# Patient Record
Sex: Male | Born: 1985 | Race: White | Hispanic: No | Marital: Married | Smoking: Never smoker
Health system: Southern US, Community
[De-identification: ages and names within clinical notes are randomized; demographics above are authoritative.]

## PROBLEM LIST (undated history)

## (undated) HISTORY — PX: HERNIA REPAIR: SHX51

---
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
## 2019-07-09 ENCOUNTER — Emergency Department (HOSPITAL_COMMUNITY)
Admission: EM | Admit: 2019-07-09 | Discharge: 2019-07-09 | Disposition: A | Discharge status: Home / Self Care | Payer: Self-pay | Attending: Emergency Medicine | Admitting: Emergency Medicine

## 2019-07-09 ENCOUNTER — Other Ambulatory Visit: Payer: Self-pay

## 2019-07-09 ENCOUNTER — Encounter (HOSPITAL_COMMUNITY): Payer: Self-pay | Admitting: Emergency Medicine

## 2019-07-09 ENCOUNTER — Emergency Department (HOSPITAL_COMMUNITY): Payer: Self-pay

## 2019-07-09 DIAGNOSIS — R0981 Nasal congestion: Secondary | ICD-10-CM | POA: Insufficient documentation

## 2019-07-09 DIAGNOSIS — M5416 Radiculopathy, lumbar region: Secondary | ICD-10-CM | POA: Insufficient documentation

## 2019-07-09 DIAGNOSIS — M545 Low back pain, unspecified: Secondary | ICD-10-CM

## 2019-07-09 DIAGNOSIS — R05 Cough: Secondary | ICD-10-CM | POA: Insufficient documentation

## 2019-07-09 DIAGNOSIS — R202 Paresthesia of skin: Secondary | ICD-10-CM | POA: Insufficient documentation

## 2019-07-09 LAB — COMPREHENSIVE METABOLIC PANEL
ALT: 25 U/L (ref 0–44)
AST: 17 U/L (ref 15–41)
Albumin: 4.3 g/dL (ref 3.5–5.0)
Alkaline Phosphatase: 77 U/L (ref 38–126)
Anion gap: 10 (ref 5–15)
BUN: 17 mg/dL (ref 6–20)
CO2: 24 mmol/L (ref 22–32)
Calcium: 8.9 mg/dL (ref 8.9–10.3)
Chloride: 109 mmol/L (ref 98–111)
Creatinine, Ser: 0.82 mg/dL (ref 0.61–1.24)
GFR calc Af Amer: >60 mL/min (ref >60)
GFR calc non Af Amer: >60 mL/min (ref >60)
Glucose, Bld: 88 mg/dL (ref 70–99)
Potassium: 4 mmol/L (ref 3.5–5.1)
Sodium: 143 mmol/L (ref 135–145)
Total Bilirubin: 0.7 mg/dL (ref 0.3–1.2)
Total Protein: 7.3 g/dL (ref 6.5–8.1)

## 2019-07-09 LAB — CBC WITH DIFFERENTIAL/PLATELET
Abs Immature Granulocytes: 0.03 10*3/uL (ref 0.00–0.07)
Basophils Absolute: 0.1 10*3/uL (ref 0.0–0.1)
Basophils Relative: 1 %
Eosinophils Absolute: 0.3 10*3/uL (ref 0.0–0.5)
Eosinophils Relative: 3 %
HCT: 45.5 % (ref 39.0–52.0)
Hemoglobin: 14.9 g/dL (ref 13.0–17.0)
Immature Granulocytes: 0 %
Lymphocytes Relative: 23 %
Lymphs Abs: 2.4 10*3/uL (ref 0.7–4.0)
MCH: 29.6 pg (ref 26.0–34.0)
MCHC: 32.7 g/dL (ref 30.0–36.0)
MCV: 90.5 fL (ref 80.0–100.0)
Monocytes Absolute: 1.5 10*3/uL — ABNORMAL HIGH (ref 0.1–1.0)
Monocytes Relative: 14 %
Neutro Abs: 6.1 10*3/uL (ref 1.7–7.7)
Neutrophils Relative %: 59 %
Platelets: 216 10*3/uL (ref 150–400)
RBC: 5.03 MIL/uL (ref 4.22–5.81)
RDW: 12.7 % (ref 11.5–15.5)
WBC: 10.5 10*3/uL (ref 4.0–10.5)
nRBC: 0 % (ref 0.0–0.2)

## 2019-07-09 LAB — URINALYSIS, ROUTINE W REFLEX MICROSCOPIC
Bacteria, UA: NONE SEEN
Bilirubin Urine: NEGATIVE
Glucose, UA: NEGATIVE mg/dL
Ketones, ur: NEGATIVE mg/dL
Leukocytes,Ua: NEGATIVE
Nitrite: NEGATIVE
Protein, ur: NEGATIVE mg/dL
Specific Gravity, Urine: 1.027 (ref 1.005–1.030)
pH: 5 (ref 5.0–8.0)

## 2019-07-09 LAB — LACTIC ACID, PLASMA: Lactic Acid, Venous: 1 mmol/L (ref 0.5–1.9)

## 2019-07-09 MED ORDER — HYDROCODONE-ACETAMINOPHEN 5-325 MG PO TABS: 1.0000 | ORAL_TABLET | Freq: Four times a day (QID) | ORAL | 0 refills | Status: AC | PRN

## 2019-07-09 MED ORDER — HYDROCODONE-ACETAMINOPHEN 5-325 MG PO TABS
1.0000 | ORAL_TABLET | Freq: Once | ORAL | Status: AC
  Administered 2019-07-09: 1 via ORAL
  Filled 2019-07-09: qty 1

## 2019-07-09 MED ORDER — LIDOCAINE 5 % EX PTCH
1.0000 | MEDICATED_PATCH | CUTANEOUS | Status: DC
  Administered 2019-07-09: 1 via TRANSDERMAL
  Filled 2019-07-09: qty 1

## 2019-07-09 MED ORDER — PREDNISONE 10 MG PO TABS
50.0000 mg | ORAL_TABLET | Freq: Every day | ORAL | 0 refills | Status: AC
Start: 2019-07-09 — End: 2019-07-14

## 2019-07-09 NOTE — ED Notes (Signed)
Patient is getting into a gown at this time, denies needs for assistance.Patient denies claustrophobia or need for concerning internal metal.

## 2019-07-09 NOTE — ED Provider Notes (Signed)
[facility]-EMERGENCY DEPT Provider Note   CSN: [id number] Arrival date & time: 07/09/19  1037     History Chief Complaint  Patient presents with  . Back Pain    Russell Stephens is a 34 y.o. male.  HPI HPI Comments: Russell Stephens is a 34 y.o. male who presents to the Emergency Department complaining of lumbar pain.  Patient states he woke this morning with 10/10 pain in his lumbar region.  Pain worsens with any movement.  He went to urgent care and had x-rays performed which were negative at the time.  He recently had a viral infection and was sent to the emergency department for further evaluation.  He states that he was experiencing cough and congestion which is gradually alleviated for the last few days.  He reports some associated mild tingling in his bilateral lower extremities.  No bowel or bladder incontinence.  He took ibuprofen for his pain with minimal relief.  No history of IVDA.  He denies fevers, chills, chest pain, shortness of breath, abdominal pain, nausea, vomiting, diarrhea, urinary changes, hematuria, dysuria, urinary frequency, urinary difficulty, syncope.     History reviewed. No pertinent past medical history.  There are no problems to display for this patient.   History reviewed. No pertinent surgical history.     No family history on file.  Social History   Tobacco Use  . Smoking status: Not on file  Substance Use Topics  . Alcohol use: Not on file  . Drug use: Not on file    Home Medications Prior to Admission medications   Medication Sig Start Date End Date Taking? Authorizing Provider  albuterol (VENTOLIN HFA) 108 (90 Base) MCG/ACT inhaler Inhale 2 puffs into the lungs every 6 (six) hours as needed for wheezing or shortness of breath.   Yes [provider]  Pseudoephedrine-APAP-DM (DAYQUIL PO) Take 30 mLs by mouth every 4 (four) hours as needed (cold and flu).   Yes [provider]    Allergies    Dairy  aid [lactase]  Review of Systems   Review of Systems  All other systems reviewed and are negative. Ten systems reviewed and are negative for acute change, except as noted in the HPI.   Physical Exam Updated Vital Signs BP (!) 152/93 (BP Location: Left Arm)   Pulse 95   Temp 98.1 F (36.7 C) (Oral)   Resp 18   SpO2 97%   Physical Exam Vitals and nursing note reviewed.  Constitutional:      General: He is in acute distress.     Appearance: Normal appearance. He is normal weight. He is not ill-appearing, toxic-appearing or diaphoretic.  HENT:     Head: Normocephalic and atraumatic.     Right Ear: External ear normal.     Left Ear: External ear normal.     Nose: Nose normal.     Mouth/Throat:     Mouth: Mucous membranes are moist.     Pharynx: Oropharynx is clear. No oropharyngeal exudate or posterior oropharyngeal erythema.  Eyes:     General: No scleral icterus.       Right eye: No discharge.        Left eye: No discharge.     Extraocular Movements: Extraocular movements intact.     Conjunctiva/sclera: Conjunctivae normal.     Pupils: Pupils are equal, round, and reactive to light.  Cardiovascular:     Rate and Rhythm: Normal rate and regular rhythm.  Pulses: Normal pulses.     Heart sounds: Normal heart sounds. No murmur. No friction rub. No gallop.   Pulmonary:     Effort: Pulmonary effort is normal. No respiratory distress.     Breath sounds: Normal breath sounds. No stridor. No wheezing, rhonchi or rales.  Abdominal:     General: Abdomen is flat.     Palpations: Abdomen is soft.     Tenderness: There is no abdominal tenderness.  Musculoskeletal:        General: Tenderness present. No signs of injury.     Cervical back: Normal range of motion.     Right lower leg: No edema.     Left lower leg: No edema.     Comments: Mild TTP noted in the midline lumbar region.  Significant TTP noted just right lateral of the midline lumbar spine along the paraspinal  musculature.  No erythema or edema noted.  No signs of injury.  Negative straight leg raise on the right.  Negative contralateral straight leg raise on the left.  Negative Kernig's and Brudzinski's sign's.  Skin:    General: Skin is warm and dry.  Neurological:     General: No focal deficit present.     Mental Status: He is alert and oriented to person, place, and time.     Comments: Distal sensation intact.  2+ patella DTRs noted bilaterally.  Unable to assess patient's gait due to severity of pain.  Strength is 5 out of 5 with flexion and extension of the lower extremities.  Psychiatric:        Mood and Affect: Mood normal.        Behavior: Behavior normal.    ED Results / Procedures / Treatments   Labs (all labs ordered are listed, but only abnormal results are displayed) Labs Reviewed  CBC WITH DIFFERENTIAL/PLATELET - Abnormal; Notable for the following components:      Result Value   Monocytes Absolute 1.5 (*)    All other components within normal limits  URINALYSIS, ROUTINE W REFLEX MICROSCOPIC - Abnormal; Notable for the following components:   Hgb urine dipstick SMALL (*)    All other components within normal limits  COMPREHENSIVE METABOLIC PANEL  LACTIC ACID, PLASMA   EKG None  Radiology MR LUMBAR SPINE WO CONTRAST  Result Date: 07/09/2019 CLINICAL DATA:  Low back pain, no red flags. Additional history provided: Patient reports low back pain since this morning, pain goes down both legs with numbness and tingling. EXAM: MRI LUMBAR SPINE WITHOUT CONTRAST TECHNIQUE: Multiplanar, multisequence MR imaging of the lumbar spine was performed. No intravenous contrast was administered. COMPARISON:  Report from CT abdomen/pelvis 04/17/2001 (images unavailable). FINDINGS: Segmentation: For the purposes of this dictation, five lumbar vertebrae are assumed and the caudal most well-formed intervertebral disc is designated L5-S1. Alignment: Straightening of the expected lumbar lordosis. 3 mm  L5-S1 grade 1 retrolisthesis. Vertebrae: Vertebral body height is maintained. Trace degenerative endplate edema at O6-Z1. Conus medullaris and cauda equina: Conus extends to the L1 level. No signal abnormality within the visualized distal spinal cord. Paraspinal and other soft tissues: No abnormality identified within included portions of the abdomen/retroperitoneum. Paraspinal soft tissues within normal limits. Disc levels: Moderate L5-S1 disc degeneration. Intervertebral disc height is otherwise maintained. T12-L1: No significant disc herniation or stenosis. L1-L2: No significant disc herniation or stenosis. L2-L3: No significant disc herniation or stenosis. L3-L4: No significant disc herniation or stenosis. L4-L5: No significant disc herniation or stenosis. L5-S1: Mild grade 1 retrolisthesis. Disc  bulge. Superimposed broad-based central disc extrusion with cephalad migration to the inferior L5 level. The disc protrusion minimally effaces the ventral thecal sac. The disc protrusion also contributes to minimal left subarticular narrowing with possible contact upon the descending left S1 nerve root. No significant right subarticular or central canal stenosis. No significant foraminal narrowing. IMPRESSION: For the purposes of this dictation, five lumbar vertebrae are assumed and the caudal most well-formed intervertebral disc is designated L5-S1. At L5-S1, there is mild grade 1 retrolisthesis. Moderate disc degeneration. Disc bulge. Superimposed broad-based central disc extrusion with mild cephalad migration. The disc protrusion minimally effaces the ventral thecal sac. The disc protrusion also contributes to minimal left subarticular narrowing with possible contact upon the descending left S1 nerve root. No significant right subarticular or central canal stenosis. No significant neural foraminal narrowing. No significant disc herniation, spinal canal stenosis or neural foraminal narrowing at the remaining lumbar  levels. Electronically Signed   By: Jackey Loge DO   On: 07/09/2019 17:25   Procedures Procedures (including critical care time)  Medications Ordered in ED Medications  lidocaine (LIDODERM) 5 % 1 patch (has no administration in time range)  HYDROcodone-acetaminophen (NORCO/VICODIN) 5-325 MG per tablet 1 tablet (1 tablet Oral Given 07/09/19 1511)   ED Course  I have reviewed the triage vital signs and the nursing notes.  Pertinent labs & imaging results that were available during my care of the patient were reviewed by me and considered in my medical decision making (see chart for details).    MDM Rules/Calculators/A&P                      4:21 PM patient is a 34 year old male with a history of lumbar strain.  Patient does have some mild midline lumbar tenderness.  Most of his tenderness is along the right lateral musculature of the lumbar region.  His physical exam is generally reassuring.  He notes some tingling in his lower extremities but his distal sensation is intact.  No symptoms of cauda equina.  No symptoms consistent with sciatica.  Due to the acute atraumatic nature of his symptoms and the tingling in his lower extremities will obtain an MRI of the lumbar spine.  His initial labs are reassuring.  No leukocytosis.  Afebrile.  Vital signs are stable.  Will reassess.  6:16 PM I spoke with patient and he says his pain is mildly alleviated.  We discussed his MRI which shows mild grade 1 retrolisthesis at L5-S1.  Additionally there is some moderate disc degeneration and a disc bulge.  Superimposed broad-based central disc extrusion with mild cephalad migration.  The disc protrusion minimally effaces the ventral thecal sac.  It also contributes to minimal left subarticular narrowing with possible contact upon the descending left S1 nerve root.  I discussed this with patient and that this could likely be the cause of the symptoms.  He does not have health insurance so I gave him referral to  [facility].  He also requests referral to [facility].  He is going to follow-up with them.  I prescribed a short course of Norco for breakthrough pain and recommend ibuprofen and Tylenol for ongoing pain.  I also recommended Voltaren gel.  Patient also given a prednisone burst.  He understands to return to the emergency department with any new or worsening symptoms.  His questions were answered and he verbalized understanding of the above plan.  He was amicable at the time of discharge.  Vital signs stable.  Patient discharged to home/self care.  Condition at discharge: Stable  Note: Portions of this report may have been transcribed using voice recognition software. Every effort was made to ensure accuracy; however, inadvertent computerized transcription errors may be present.    Final Clinical Impression(s) / ED Diagnoses Final diagnoses:  Acute midline low back pain without sciatica  Lumbar radiculopathy    Rx / DC Orders ED Discharge Orders         Ordered    HYDROcodone-acetaminophen (NORCO/VICODIN) 5-325 MG tablet  Every 6 hours PRN     07/09/19 1802    predniSONE (DELTASONE) 10 MG tablet  Daily with breakfast     07/09/19 1802           Placido Sou, PA-C 07/09/19 1831    Charlynne Pander, MD 07/10/19 [phone]

## 2019-07-09 NOTE — Discharge Instructions (Addendum)
Per our discussion, I am prescribing you 2 new medications.  The first medication is Vicodin which is a strong narcotic pain reliever.  You can take this 2-3 times per day for breakthrough pain.  Please keep in mind that this has Tylenol in it as well.  So, if you also take Tylenol please be sure to not exceed 4 g/day.  This medication can also be constipating so please be sure to stay hydrated and eat an appropriate amount of fiber.  I am also prescribing you prednisone.  This is a steroid medication and can be used to help with the inflammation that may or may not be causing her back pain.  Please take this in the morning as it can have a stimulating effect and make it difficult to sleep.  I would recommend purchasing Voltaren gel and applying to the affected region as needed.  Please follow-up with  and wellness as well as [facility] regarding this visit.  Please do not hesitate to return to the emergency department with any new or worsening symptoms.

## 2019-07-09 NOTE — ED Triage Notes (Signed)
Per pt, states he woke up this am with lower back pain-no injury or trauma-went to UC a had urine and xray done-both negative-told him to come to ED for possible infection in spine-no IV drugs

## 2020-08-06 ENCOUNTER — Ambulatory Visit
Admission: EM | Admit: 2020-08-06 | Discharge: 2020-08-06 | Disposition: A | Discharge status: Home / Self Care | Payer: 59 | Attending: Emergency Medicine | Admitting: Emergency Medicine

## 2020-08-06 ENCOUNTER — Other Ambulatory Visit: Payer: Self-pay

## 2020-08-06 DIAGNOSIS — J0100 Acute maxillary sinusitis, unspecified: Secondary | ICD-10-CM | POA: Diagnosis present

## 2020-08-06 DIAGNOSIS — J029 Acute pharyngitis, unspecified: Secondary | ICD-10-CM | POA: Insufficient documentation

## 2020-08-06 LAB — POCT RAPID STREP A (OFFICE): Rapid Strep A Screen: NEGATIVE

## 2020-08-06 MED ORDER — AMOXICILLIN 875 MG PO TABS: 875.0000 mg | ORAL_TABLET | Freq: Two times a day (BID) | ORAL | 0 refills | Status: AC

## 2020-08-06 NOTE — ED Provider Notes (Signed)
Renaldo Fiddler    CSN: [id number] Arrival date & time: 08/06/20  1127      History   Chief Complaint Chief Complaint  Patient presents with  . Cough    HPI Russell Stephens is a 35 y.o. male.   Patient presents with 2-week history of sore throat, nasal congestion, postnasal drip, cough.  He reports hoarse voice x2 days.  He denies fever, chills, rash, shortness of breath, vomiting, diarrhea, or other symptoms.  OTC cough and cold medication taken at home.  He denies pertinent medical history.  The history is provided by the patient.    History reviewed. No pertinent past medical history.  There are no problems to display for this patient.   Past Surgical History:  Procedure Laterality Date  . HERNIA REPAIR         Home Medications    Prior to Admission medications   Medication Sig Start Date End Date Taking? Authorizing Provider  amoxicillin (AMOXIL) 875 MG tablet Take 1 tablet (875 mg total) by mouth 2 (two) times daily for 7 days. 08/06/20 08/13/20 Yes Mickie Bail, NP  albuterol (VENTOLIN HFA) 108 (90 Base) MCG/ACT inhaler Inhale 2 puffs into the lungs every 6 (six) hours as needed for wheezing or shortness of breath.    [provider]  Pseudoephedrine-APAP-DM (DAYQUIL PO) Take 30 mLs by mouth every 4 (four) hours as needed (cold and flu).    [provider]    Family History No family history on file.  Social History Social History   Tobacco Use  . Smoking status: Never Smoker  . Smokeless tobacco: Never Used  Substance Use Topics  . Alcohol use: Yes  . Drug use: Not Currently     Allergies   Dairy aid [lactase]   Review of Systems Review of Systems  Constitutional: Negative for chills and fever.  HENT: Positive for congestion, postnasal drip, rhinorrhea, sinus pressure, sore throat and voice change. Negative for ear pain.   Respiratory: Positive for cough. Negative for shortness of breath.   Cardiovascular: Negative for  chest pain and palpitations.  Gastrointestinal: Negative for abdominal pain, diarrhea and vomiting.  Skin: Negative for color change and rash.  All other systems reviewed and are negative.    Physical Exam Triage Vital Signs ED Triage Vitals  Enc Vitals Group     BP      Pulse      Resp      Temp      Temp src      SpO2      Weight      Height      Head Circumference      Peak Flow      Pain Score      Pain Loc      Pain Edu?      Excl. in GC?    No data found.  Updated Vital Signs BP 132/83   Pulse 96   Temp 97.9 F (36.6 C) (Oral)   Resp 18   Ht 5\' 9"  (1.753 m)   Wt 230 lb (104.3 kg)   SpO2 96%   BMI 33.97 kg/m   Visual Acuity Right Eye Distance:   Left Eye Distance:   Bilateral Distance:    Right Eye Near:   Left Eye Near:    Bilateral Near:     Physical Exam Vitals and nursing note reviewed.  Constitutional:      General: He is not in acute  distress.    Appearance: He is well-developed.  HENT:     Head: Normocephalic and atraumatic.     Right Ear: Tympanic membrane normal.     Left Ear: Tympanic membrane normal.     Nose: Congestion present.     Mouth/Throat:     Mouth: Mucous membranes are moist.     Pharynx: Posterior oropharyngeal erythema present.  Eyes:     Conjunctiva/sclera: Conjunctivae normal.  Cardiovascular:     Rate and Rhythm: Normal rate and regular rhythm.     Heart sounds: Normal heart sounds.  Pulmonary:     Effort: Pulmonary effort is normal. No respiratory distress.     Breath sounds: Normal breath sounds.  Abdominal:     Palpations: Abdomen is soft.     Tenderness: There is no abdominal tenderness.  Musculoskeletal:     Cervical back: Neck supple.  Skin:    General: Skin is warm and dry.  Neurological:     General: No focal deficit present.     Mental Status: He is alert and oriented to person, place, and time.     Gait: Gait normal.  Psychiatric:        Mood and Affect: Mood normal.        Behavior: Behavior  normal.      UC Treatments / Results  Labs (all labs ordered are listed, but only abnormal results are displayed) Labs Reviewed  CULTURE, GROUP A STREP [facility])  POCT RAPID STREP A (OFFICE)    EKG   Radiology No results found.  Procedures Procedures (including critical care time)  Medications Ordered in UC Medications - No data to display  Initial Impression / Assessment and Plan / UC Course  I have reviewed the triage vital signs and the nursing notes.  Pertinent labs & imaging results that were available during my care of the patient were reviewed by me and considered in my medical decision making (see chart for details).   Acute sinusitis, acute pharyngitis.  Rapid strep negative; culture pending.  Patient has been symptomatic for 2+ weeks.  He has not improved with OTC treatment.  Treating with amoxicillin today.  Discussed continued symptomatic treatment with Tylenol or ibuprofen as needed for fever or discomfort and plain Mucinex as needed for congestion.  Instructed him to follow-up with his PCP if his symptoms are not improving.  He agrees to plan of care.   Final Clinical Impressions(s) / UC Diagnoses   Final diagnoses:  Acute non-recurrent maxillary sinusitis  Acute pharyngitis, unspecified etiology     Discharge Instructions     Your rapid strep test is negative.  A throat culture is pending; we will call you if it is positive requiring treatment.    Take the amoxicillin as directed for your sinus infection.  Take Tylenol or ibuprofen as needed for fever or discomfort.  Take plain over-the-counter Mucinex as needed for congestion.    Follow up with your primary care provider if your symptoms are not improving.        ED Prescriptions    Medication Sig Dispense Auth. Provider   amoxicillin (AMOXIL) 875 MG tablet Take 1 tablet (875 mg total) by mouth 2 (two) times daily for 7 days. 14 tablet Mickie Bail, NP     PDMP not reviewed this encounter.    Mickie Bail, NP 08/06/20 1204

## 2020-08-06 NOTE — Discharge Instructions (Signed)
Your rapid strep test is negative.  A throat culture is pending; we will call you if it is positive requiring treatment.    Take the amoxicillin as directed for your sinus infection.  Take Tylenol or ibuprofen as needed for fever or discomfort.  Take plain over-the-counter Mucinex as needed for congestion.    Follow up with your primary care provider if your symptoms are not improving.

## 2020-08-06 NOTE — ED Triage Notes (Signed)
Pt reports having cough and sore throat x2 weeks. sts it is painful when swallowing.  Also sts he lost his voice 2 days ago.

## 2020-08-09 LAB — CULTURE, GROUP A STREP (THRC)

## 2021-04-09 IMAGING — MR MR LUMBAR SPINE W/O CM
5 series · 32 of 48 positions shown · non-contrast
Comparison: Report from CT abdomen/pelvis 04/17/2001 (images
unavailable).

CLINICAL DATA: Low back pain, no red flags. Additional history
provided: Patient reports low back pain since this morning, pain
goes down both legs with numbness and tingling.

EXAM:
MRI LUMBAR SPINE WITHOUT CONTRAST
TECHNIQUE: Multiplanar, multisequence MR imaging of the lumbar spine was
performed. No intravenous contrast was administered.

[Series 5: T1 · sagittal · 4.0mm · 1.02mm/px · 6 of 15 slices shown (1 of 2)]
[im 1/15]
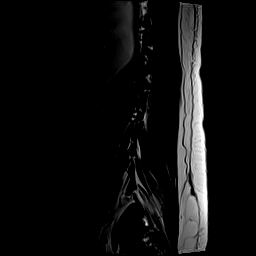
[im 3/15]
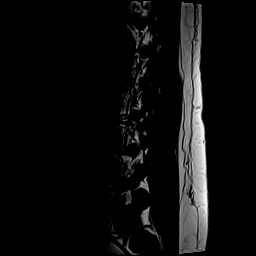
[im 6/15]
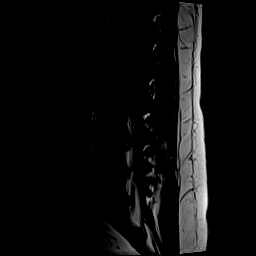
[im 9/15]
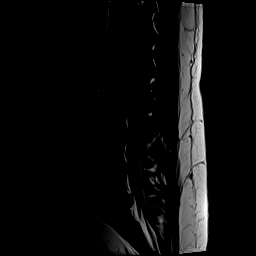
[im 12/15]
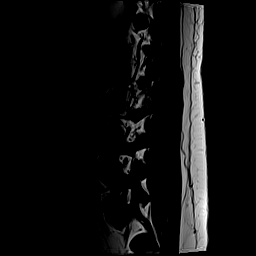
[im 15/15]
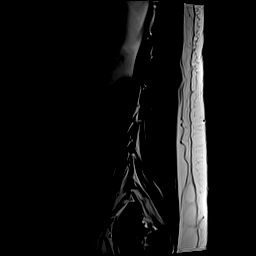

[Series 6: T2 · sagittal · 4.0mm · 1.02mm/px · 6 of 15 slices shown (1 of 2)]
[im 1/15]
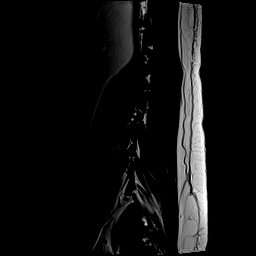
[im 3/15]
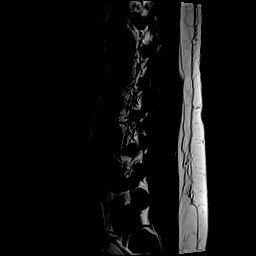
[im 6/15]
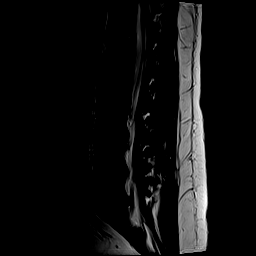
[im 9/15]
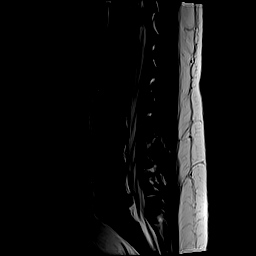
[im 12/15]
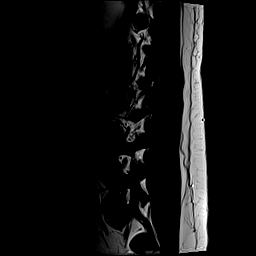
[im 15/15]
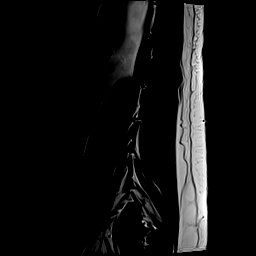

[Series 7: STIR · sagittal · 4.0mm · 0.51mm/px · 2 of 15 slices shown]
[im 1/15]
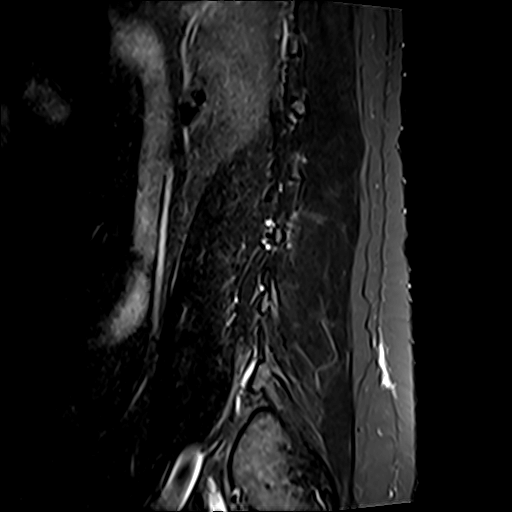
[im 3/15]
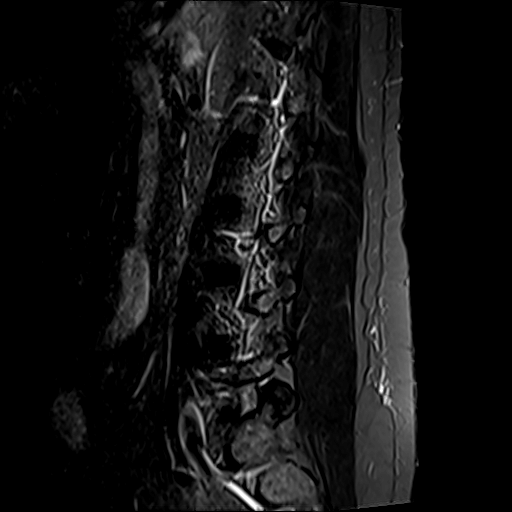

[Series 8: T2 · axial · 4.0mm · 0.78mm/px · z∈[-62,+134]mm · 9 of 36 slices shown (2 of 2)]
[im 1/36]
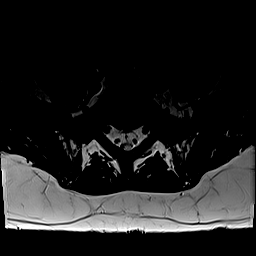
[im 6/36]
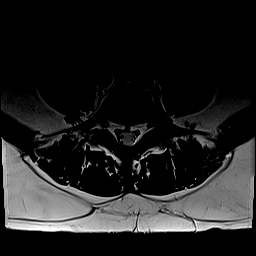
[im 11/36]
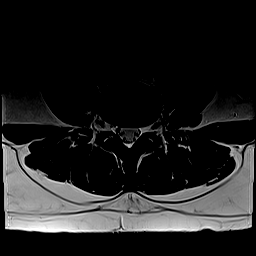
[im 16/36]
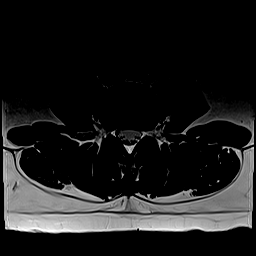
[im 18/36]
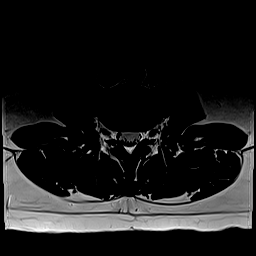
[im 21/36]
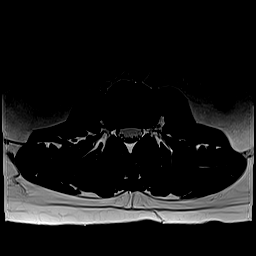
[im 26/36]
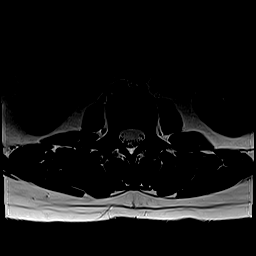
[im 31/36]
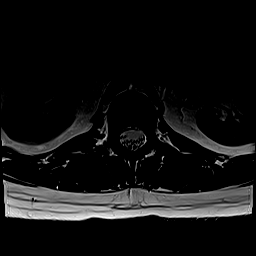
[im 36/36]
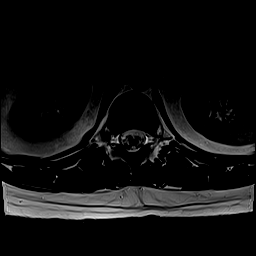

[Series 9: T1 · axial · 4.0mm · 0.43mm/px · z∈[-62,+135]mm · 9 of 36 slices shown (2 of 2)]
[im 1/36]
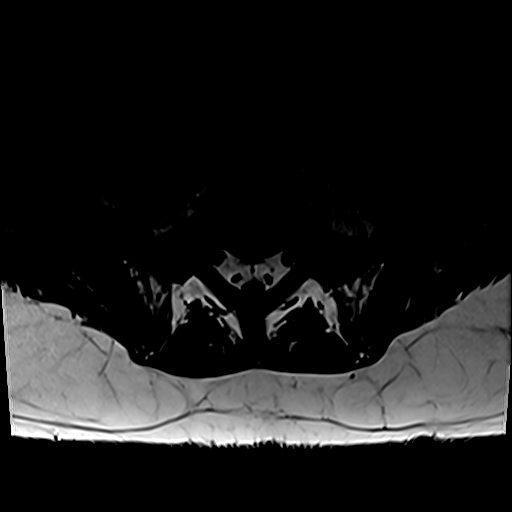
[im 6/36]
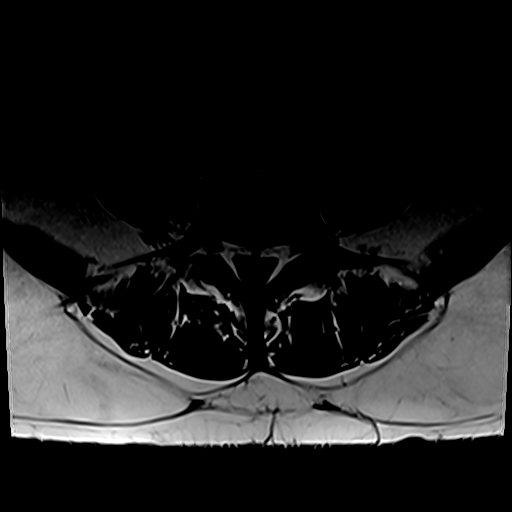
[im 11/36]
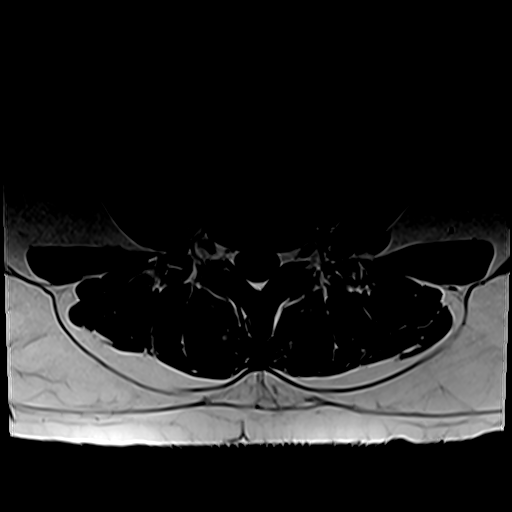
[im 16/36]
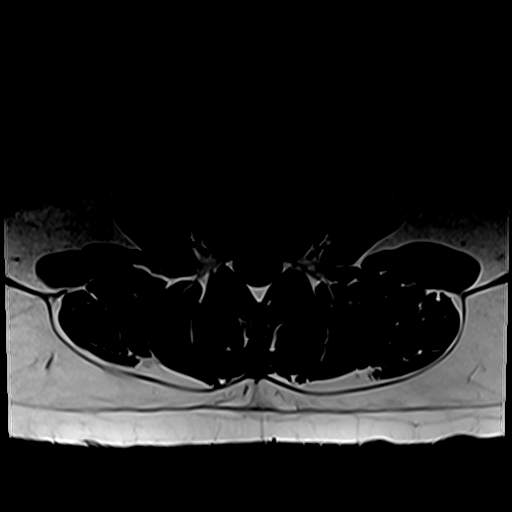
[im 18/36]
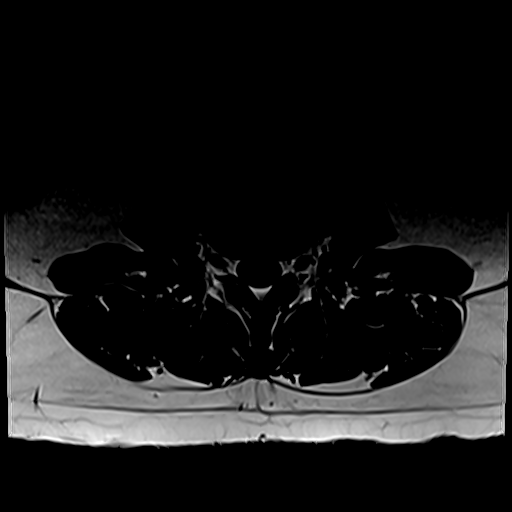
[im 21/36]
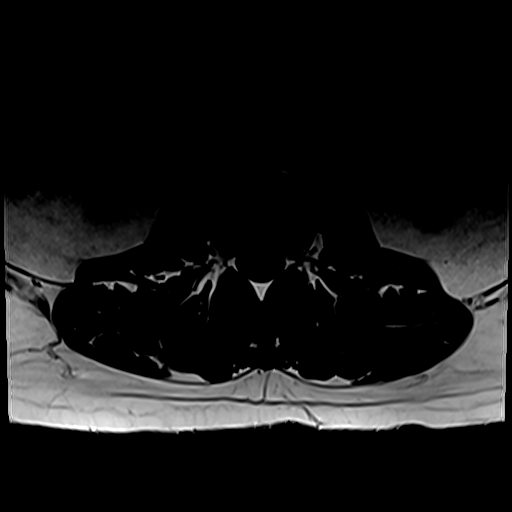
[im 26/36]
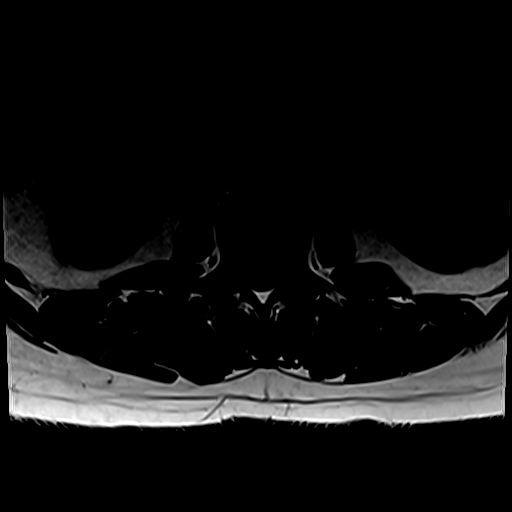
[im 31/36]
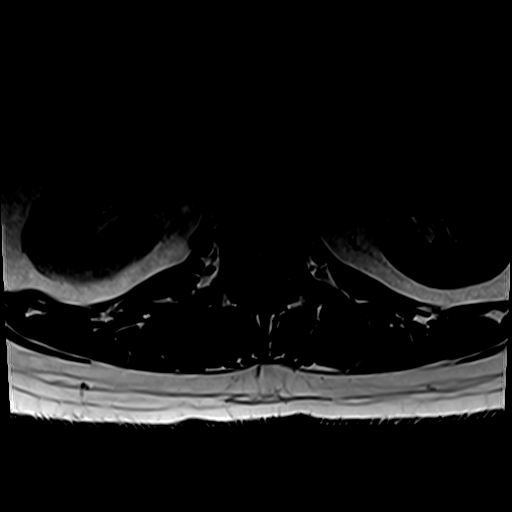
[im 36/36]
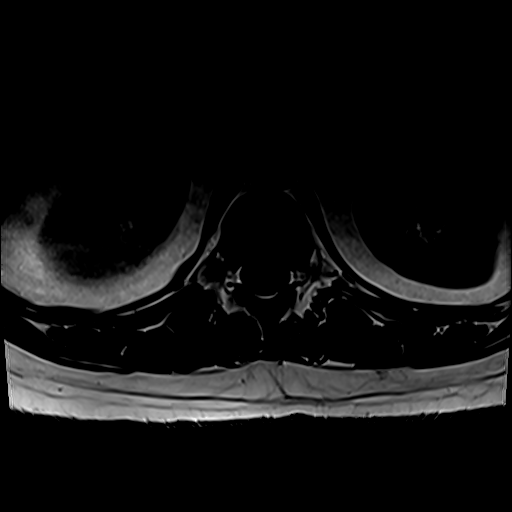

[32 of 48 positions shown; findings below may reference images not displayed]

FINDINGS: Segmentation: For the purposes of this dictation, five lumbar
vertebrae are assumed and the caudal most well-formed intervertebral
disc is designated L5-S1.

Alignment: Straightening of the expected lumbar lordosis. 3 mm L5-S1
grade 1 retrolisthesis.

Vertebrae: Vertebral body height is maintained. Trace degenerative
endplate edema at L5-S1.

Conus medullaris and cauda equina: Conus extends to the L1 level. No
signal abnormality within the visualized distal spinal cord.

Paraspinal and other soft tissues: No abnormality identified within
included portions of the abdomen/retroperitoneum. Paraspinal soft
tissues within normal limits.

Disc levels:

Moderate L5-S1 disc degeneration. Intervertebral disc height is
otherwise maintained.

T12-L1: No significant disc herniation or stenosis.

L1-L2: No significant disc herniation or stenosis.

L2-L3: No significant disc herniation or stenosis.

L3-L4: No significant disc herniation or stenosis.

L4-L5: No significant disc herniation or stenosis.

L5-S1: Mild grade 1 retrolisthesis. Disc bulge. Superimposed
broad-based central disc extrusion with cephalad migration to the
inferior L5 level. The disc protrusion minimally effaces the ventral
thecal sac. The disc protrusion also contributes to minimal left
subarticular narrowing with possible contact upon the descending
left S1 nerve root. No significant right subarticular or central
canal stenosis. No significant foraminal narrowing.
IMPRESSION: For the purposes of this dictation, five lumbar vertebrae are
assumed and the caudal most well-formed intervertebral disc is
designated L5-S1.

At L5-S1, there is mild grade 1 retrolisthesis. Moderate disc
degeneration. Disc bulge. Superimposed broad-based central disc
extrusion with mild cephalad migration. The disc protrusion
minimally effaces the ventral thecal sac. The disc protrusion also
contributes to minimal left subarticular narrowing with possible
contact upon the descending left S1 nerve root. No significant right
subarticular or central canal stenosis. No significant neural
foraminal narrowing.

No significant disc herniation, spinal canal stenosis or neural
foraminal narrowing at the remaining lumbar levels.

## 2023-02-14 ENCOUNTER — Inpatient Hospital Stay (HOSPITAL_COMMUNITY)
Admission: EM | Admit: 2023-02-14 | Discharge: 2023-02-17 | DRG: 392 | Disposition: A | Discharge status: Home / Self Care | Payer: Medicaid Other | Attending: Internal Medicine | Admitting: Internal Medicine

## 2023-02-14 ENCOUNTER — Emergency Department (HOSPITAL_COMMUNITY): Payer: Medicaid Other

## 2023-02-14 ENCOUNTER — Other Ambulatory Visit: Payer: Self-pay

## 2023-02-14 DIAGNOSIS — Z6836 Body mass index (BMI) 36.0-36.9, adult: Secondary | ICD-10-CM

## 2023-02-14 DIAGNOSIS — E66812 Obesity, class 2: Secondary | ICD-10-CM | POA: Diagnosis present

## 2023-02-14 DIAGNOSIS — E860 Dehydration: Principal | ICD-10-CM | POA: Diagnosis present

## 2023-02-14 DIAGNOSIS — N179 Acute kidney failure, unspecified: Secondary | ICD-10-CM | POA: Diagnosis present

## 2023-02-14 DIAGNOSIS — I951 Orthostatic hypotension: Secondary | ICD-10-CM | POA: Diagnosis present

## 2023-02-14 DIAGNOSIS — A059 Bacterial foodborne intoxication, unspecified: Principal | ICD-10-CM | POA: Diagnosis present

## 2023-02-14 DIAGNOSIS — R55 Syncope and collapse: Secondary | ICD-10-CM

## 2023-02-14 DIAGNOSIS — Z8249 Family history of ischemic heart disease and other diseases of the circulatory system: Secondary | ICD-10-CM

## 2023-02-14 DIAGNOSIS — J45909 Unspecified asthma, uncomplicated: Secondary | ICD-10-CM | POA: Diagnosis present

## 2023-02-14 DIAGNOSIS — Z1152 Encounter for screening for COVID-19: Secondary | ICD-10-CM

## 2023-02-14 DIAGNOSIS — K529 Noninfective gastroenteritis and colitis, unspecified: Secondary | ICD-10-CM | POA: Diagnosis not present

## 2023-02-14 DIAGNOSIS — E861 Hypovolemia: Secondary | ICD-10-CM | POA: Diagnosis present

## 2023-02-14 LAB — BASIC METABOLIC PANEL
Anion gap: 11 (ref 5–15)
BUN: 21 mg/dL — ABNORMAL HIGH (ref 6–20)
CO2: 19 mmol/L — ABNORMAL LOW (ref 22–32)
Calcium: 9.8 mg/dL (ref 8.9–10.3)
Chloride: 106 mmol/L (ref 98–111)
Creatinine, Ser: 1.91 mg/dL — ABNORMAL HIGH (ref 0.61–1.24)
GFR, Estimated: 46 mL/min — ABNORMAL LOW (ref >60)
Glucose, Bld: 144 mg/dL — ABNORMAL HIGH (ref 70–99)
Potassium: 4.2 mmol/L (ref 3.5–5.1)
Sodium: 136 mmol/L (ref 135–145)

## 2023-02-14 LAB — URINALYSIS, ROUTINE W REFLEX MICROSCOPIC
Bilirubin Urine: NEGATIVE
Glucose, UA: NEGATIVE mg/dL
Ketones, ur: NEGATIVE mg/dL
Leukocytes,Ua: NEGATIVE
Nitrite: NEGATIVE
Protein, ur: 100 mg/dL — AB
Specific Gravity, Urine: >1.046 — ABNORMAL HIGH (ref 1.005–1.030)
pH: 5 (ref 5.0–8.0)

## 2023-02-14 LAB — CBC
HCT: 53.1 % — ABNORMAL HIGH (ref 39.0–52.0)
Hemoglobin: 16.9 g/dL (ref 13.0–17.0)
MCH: 28 pg (ref 26.0–34.0)
MCHC: 31.8 g/dL (ref 30.0–36.0)
MCV: 88.1 fL (ref 80.0–100.0)
Platelets: 253 10*3/uL (ref 150–400)
RBC: 6.03 MIL/uL — ABNORMAL HIGH (ref 4.22–5.81)
RDW: 13.8 % (ref 11.5–15.5)
WBC: 19.4 10*3/uL — ABNORMAL HIGH (ref 4.0–10.5)
nRBC: 0 % (ref 0.0–0.2)

## 2023-02-14 LAB — RESP PANEL BY RT-PCR (RSV, FLU A&B, COVID)  RVPGX2
Influenza A by PCR: NEGATIVE
Influenza B by PCR: NEGATIVE
Resp Syncytial Virus by PCR: NEGATIVE
SARS Coronavirus 2 by RT PCR: NEGATIVE

## 2023-02-14 LAB — CBG MONITORING, ED: Glucose-Capillary: 166 mg/dL — ABNORMAL HIGH (ref 70–99)

## 2023-02-14 MED ORDER — KETOROLAC TROMETHAMINE 15 MG/ML IJ SOLN
15.0000 mg | Freq: Once | INTRAMUSCULAR | Status: AC
  Administered 2023-02-14: 15 mg via INTRAVENOUS
  Filled 2023-02-14: qty 1

## 2023-02-14 MED ORDER — SENNOSIDES-DOCUSATE SODIUM 8.6-50 MG PO TABS: 1.0000 | ORAL_TABLET | Freq: Every evening | ORAL | Status: DC | PRN

## 2023-02-14 MED ORDER — MORPHINE SULFATE (PF) 4 MG/ML IV SOLN
4.0000 mg | Freq: Once | INTRAVENOUS | Status: AC
  Administered 2023-02-14: 4 mg via INTRAVENOUS
  Filled 2023-02-14: qty 1

## 2023-02-14 MED ORDER — IOHEXOL 350 MG/ML SOLN
75.0000 mL | Freq: Once | INTRAVENOUS | Status: AC | PRN
  Administered 2023-02-14: 75 mL via INTRAVENOUS

## 2023-02-14 MED ORDER — ACETAMINOPHEN 325 MG PO TABS
650.0000 mg | ORAL_TABLET | Freq: Once | ORAL | Status: AC
  Administered 2023-02-14: 650 mg via ORAL
  Filled 2023-02-14: qty 2

## 2023-02-14 MED ORDER — ONDANSETRON HCL 4 MG/2ML IJ SOLN
4.0000 mg | Freq: Once | INTRAMUSCULAR | Status: AC
  Administered 2023-02-14: 4 mg via INTRAVENOUS
  Filled 2023-02-14: qty 2

## 2023-02-14 MED ORDER — PANTOPRAZOLE SODIUM 40 MG PO TBEC
40.0000 mg | DELAYED_RELEASE_TABLET | Freq: Once | ORAL | Status: AC
  Administered 2023-02-14: 40 mg via ORAL
  Filled 2023-02-14: qty 1

## 2023-02-14 MED ORDER — SODIUM CHLORIDE 0.9 % IV BOLUS
30.0000 mL/kg | Freq: Once | INTRAVENOUS | Status: AC
  Administered 2023-02-14: 2121 mL via INTRAVENOUS

## 2023-02-14 MED ORDER — LACTATED RINGERS IV BOLUS
1000.0000 mL | Freq: Once | INTRAVENOUS | Status: AC
  Administered 2023-02-14: 1000 mL via INTRAVENOUS

## 2023-02-14 MED ORDER — LOPERAMIDE HCL 2 MG PO CAPS
2.0000 mg | ORAL_CAPSULE | ORAL | Status: DC | PRN
  Filled 2023-02-14 (×2): qty 1

## 2023-02-14 MED ORDER — SODIUM CHLORIDE 0.9 % IV SOLN: INTRAVENOUS | Status: AC

## 2023-02-14 MED ORDER — ONDANSETRON HCL 4 MG/2ML IJ SOLN
4.0000 mg | Freq: Four times a day (QID) | INTRAMUSCULAR | Status: DC | PRN
  Administered 2023-02-15 – 2023-02-16 (×4): 4 mg via INTRAVENOUS
  Filled 2023-02-14 (×4): qty 2

## 2023-02-14 MED ORDER — ACETAMINOPHEN 325 MG PO TABS
650.0000 mg | ORAL_TABLET | Freq: Four times a day (QID) | ORAL | Status: DC | PRN
  Administered 2023-02-17: 650 mg via ORAL
  Filled 2023-02-14 (×2): qty 2

## 2023-02-14 MED ORDER — SODIUM CHLORIDE 0.9 % IV BOLUS
1000.0000 mL | Freq: Once | INTRAVENOUS | Status: AC
  Administered 2023-02-14: 1000 mL via INTRAVENOUS

## 2023-02-14 MED ORDER — ACETAMINOPHEN 650 MG RE SUPP: 650.0000 mg | Freq: Four times a day (QID) | RECTAL | Status: DC | PRN

## 2023-02-14 MED ORDER — HEPARIN SODIUM (PORCINE) 5000 UNIT/ML IJ SOLN
5000.0000 [IU] | Freq: Three times a day (TID) | INTRAMUSCULAR | Status: DC
  Administered 2023-02-15 – 2023-02-17 (×8): 5000 [IU] via SUBCUTANEOUS
  Filled 2023-02-14 (×8): qty 1

## 2023-02-14 NOTE — ED Notes (Signed)
Patient noted to be vomiting and dry heaving by this RN.

## 2023-02-14 NOTE — ED Notes (Signed)
Pt ambulated to restroom with assistance.  

## 2023-02-14 NOTE — ED Triage Notes (Signed)
Pt to ED c/o vomitting and diarrhea that started in the middle of the night. Pt reports he believes he has food poisioning.   Of note pt reports aprox 1 hour when walking to the bathroom, he "blacked out" Reports wife caught him so did not hit floor.   NAD noted in triage,   Reports no relief with OTC medications.

## 2023-02-14 NOTE — ED Notes (Signed)
Pt ambulatory to bathroom

## 2023-02-14 NOTE — ED Notes (Signed)
Pt inquiring about nausea and pain meds and beverages. Physician notified.

## 2023-02-14 NOTE — ED Notes (Signed)
ED TO INPATIENT HANDOFF REPORT  ED Nurse Name and Phone #: Tresa Endo [phone]  S Name/Age/Gender Russell Stephens 37 y.o. male Room/Bed: 033C/033C  Code Status   Code Status: Full Code  Home/SNF/Other Home Patient oriented to: self, place, time, and situation Is this baseline? No   Triage Complete: Triage complete  Chief Complaint AKI (acute kidney injury) (HCC) [N17.9]  Triage Note Pt to ED c/o vomitting and diarrhea that started in the middle of the night. Pt reports he believes he has food poisioning.   Of note pt reports aprox 1 hour when walking to the bathroom, he "blacked out" Reports wife caught him so did not hit floor.   NAD noted in triage,   Reports no relief with OTC medications.    Allergies Allergies  Allergen Reactions   Dairy Aid [Tilactase] Other (See Comments)    Upset stomach; "tears me up"    Level of Care/Admitting Diagnosis ED Disposition     ED Disposition  Admit   Condition  --   Comment  Hospital Area: [facility] [[id number]]  Level of Care: Telemetry Medical [104]  May place patient in observation at [facility] or [facility] if equivalent level of care is available:: Yes  Covid Evaluation: Confirmed COVID Negative  Diagnosis: AKI (acute kidney injury) [facility]) [[id number]]  Admitting Physician: Gery Pray [[id number]]  Attending Physician: Gery Pray [[id number]]          B Medical/Surgery History No past medical history on file. Past Surgical History:  Procedure Laterality Date   HERNIA REPAIR       A IV Location/Drains/Wounds Patient Lines/Drains/Airways Status     Active Line/Drains/Airways     Name Placement date Placement time Site Days   Peripheral IV 02/14/23 20 G Left Antecubital 02/14/23  1546  Antecubital  less than 1            Intake/Output Last 24 hours  Intake/Output Summary (Last 24 hours) at 02/14/2023 2309 Last data filed at 02/14/2023 2253 Gross per 24 hour  Intake 1006.08 ml  Output --   Net 1006.08 ml    Labs/Imaging Results for orders placed or performed during the hospital encounter of 02/14/23 (from the past 48 hour(s))  Basic metabolic panel     Status: Abnormal   Collection Time: 02/14/23  2:09 PM  Result Value Ref Range   Sodium 136 135 - 145 mmol/L   Potassium 4.2 3.5 - 5.1 mmol/L   Chloride 106 98 - 111 mmol/L   CO2 19 (L) 22 - 32 mmol/L   Glucose, Bld 144 (H) 70 - 99 mg/dL    Comment: Glucose reference range applies only to samples taken after fasting for at least 8 hours.   BUN 21 (H) 6 - 20 mg/dL   Creatinine, Ser 1.19 (H) 0.61 - 1.24 mg/dL   Calcium 9.8 8.9 - 14.7 mg/dL   GFR, Estimated 46 (L) >60 mL/min    Comment: (NOTE) Calculated using the CKD-EPI Creatinine Equation (2021)    Anion gap 11 5 - 15    Comment: Performed at [facility], [address], [place], [place] [address]  CBC     Status: Abnormal   Collection Time: 02/14/23  2:09 PM  Result Value Ref Range   WBC 19.4 (H) 4.0 - 10.5 K/uL   RBC 6.03 (H) 4.22 - 5.81 MIL/uL   Hemoglobin 16.9 13.0 - 17.0 g/dL   HCT 21.3 (H) 08.6 - 57.8 %   MCV  88.1 80.0 - 100.0 fL   MCH 28.0 26.0 - 34.0 pg   MCHC 31.8 30.0 - 36.0 g/dL   RDW 40.9 81.1 - 91.4 %   Platelets 253 150 - 400 K/uL   nRBC 0.0 0.0 - 0.2 %    Comment: Performed at [facility], [address] [place], [place] [address]  CBG monitoring, ED     Status: Abnormal   Collection Time: 02/14/23  2:14 PM  Result Value Ref Range   Glucose-Capillary 166 (H) 70 - 99 mg/dL    Comment: Glucose reference range applies only to samples taken after fasting for at least 8 hours.  Urinalysis, Routine w reflex microscopic -Urine, Clean Catch     Status: Abnormal   Collection Time: 02/14/23  6:16 PM  Result Value Ref Range   Color, Urine AMBER (A) YELLOW    Comment: BIOCHEMICALS MAY BE AFFECTED BY COLOR   APPearance HAZY (A) CLEAR   Specific Gravity, Urine >1.046 (H) 1.005 - 1.030   pH 5.0 5.0 - 8.0   Glucose, UA NEGATIVE NEGATIVE  mg/dL   Hgb urine dipstick SMALL (A) NEGATIVE   Bilirubin Urine NEGATIVE NEGATIVE   Ketones, ur NEGATIVE NEGATIVE mg/dL   Protein, ur 621 (A) NEGATIVE mg/dL   Nitrite NEGATIVE NEGATIVE   Leukocytes,Ua NEGATIVE NEGATIVE   RBC / HPF 11-20 0 - 5 RBC/hpf   WBC, UA 6-10 0 - 5 WBC/hpf   Bacteria, UA FEW (A) NONE SEEN   Squamous Epithelial / HPF 0-5 0 - 5 /HPF   Mucus PRESENT    Hyaline Casts, UA PRESENT     Comment: Performed at [facility], [address], [place], [place] [address]  Resp panel by RT-PCR (RSV, Flu A&B, Covid)     Status: None   Collection Time: 02/14/23  9:19 PM   Specimen: Nasal Swab  Result Value Ref Range   SARS Coronavirus 2 by RT PCR NEGATIVE NEGATIVE   Influenza A by PCR NEGATIVE NEGATIVE   Influenza B by PCR NEGATIVE NEGATIVE    Comment: (NOTE) The Xpert Xpress SARS-CoV-2/FLU/RSV plus assay is intended as an aid in the diagnosis of influenza from Nasopharyngeal swab specimens and should not be used as a sole basis for treatment. Nasal washings and aspirates are unacceptable for Xpert Xpress SARS-CoV-2/FLU/RSV testing.  Fact Sheet for Patients: BloggerCourse.com  Fact Sheet for Healthcare Providers: SeriousBroker.it  This test is not yet approved or cleared by the [place] FDA and has been authorized for detection and/or diagnosis of SARS-CoV-2 by FDA under an Emergency Use Authorization (EUA). This EUA will remain in effect (meaning this test can be used) for the duration of the COVID-19 declaration under Section 564(b)(1) of the Act, 21 U.S.C. section 360bbb-3(b)(1), unless the authorization is terminated or revoked.     Resp Syncytial Virus by PCR NEGATIVE NEGATIVE    Comment: (NOTE) Fact Sheet for Patients: BloggerCourse.com  Fact Sheet for Healthcare Providers: SeriousBroker.it  This test is not yet approved or cleared by the [place] FDA and has been authorized for detection and/or diagnosis of SARS-CoV-2 by FDA under an Emergency Use Authorization (EUA). This EUA will remain in effect (meaning this test can be used) for the duration of the COVID-19 declaration under Section 564(b)(1) of the Act, 21 U.S.C. section 360bbb-3(b)(1), unless the authorization is terminated or revoked.  Performed at [facility], [address], [place], [place] [address]    CT ABDOMEN PELVIS W  CONTRAST  Result Date: 02/14/2023 CLINICAL DATA:  Diarrhea and emesis EXAM: CT ABDOMEN AND PELVIS WITH CONTRAST TECHNIQUE: Multidetector CT imaging of the abdomen and pelvis was performed using the standard protocol following bolus administration of intravenous contrast. RADIATION DOSE REDUCTION: This exam was performed according to the departmental dose-optimization program which includes automated exposure control, adjustment of the mA and/or kV according to patient size and/or use of iterative reconstruction technique. CONTRAST:  75mL OMNIPAQUE IOHEXOL 350 MG/ML SOLN COMPARISON:  CT report 04/17/2001 FINDINGS: Lower chest: Lung bases are clear Hepatobiliary: No focal liver abnormality is seen. No gallstones, gallbladder wall thickening, or biliary dilatation. Pancreas: Unremarkable. No pancreatic ductal dilatation or surrounding inflammatory changes. Spleen: Normal in size without focal abnormality. Adrenals/Urinary Tract: Adrenal glands are unremarkable. Kidneys are normal, without renal calculi, focal lesion, or hydronephrosis. Bladder is unremarkable. Stomach/Bowel: Mild fluid distension of stomach. Small hiatal hernia diffuse fluid-filled small and large bowel with some proximal small bowel distension up to 3.3 cm but no well-defined transition point. Mild small bowel wall thickening in the right upper quadrant, for example series 2, image 38. Negative appendix. Vascular/Lymphatic: No significant vascular findings are present. No enlarged  abdominal or pelvic lymph nodes. Reproductive: Prostate is unremarkable. Other: Negative for pelvic effusion or free air. Small fat containing left inguinal hernia Musculoskeletal: No acute or significant osseous findings. IMPRESSION: 1. Diffuse fluid-filled small and large bowel with some proximal small bowel distension but no well-defined transition point. Mild areas of small bowel wall thickening. Findings are suggestive of enteritis/diarrheal illness. No convincing obstruction 2. Small hiatal hernia. Electronically Signed   By: Jasmine Pang M.D.   On: 02/14/2023 21:20    Pending Labs Unresulted Labs (From admission, onward)     Start     Ordered   02/15/23 0500  Basic metabolic panel  Tomorrow morning,   R        02/14/23 2232   02/15/23 0500  CBC with Differential/Platelet  Tomorrow morning,   R        02/14/23 2232   02/14/23 2233  Magnesium  Add-on,   AD        02/14/23 2232   02/14/23 2130  C Difficile Quick Screen w PCR reflex  (C Difficile quick screen w PCR reflex panel )  Once, for 24 hours,   URGENT       References:    CDiff Information Tool   02/14/23 2129   02/14/23 2103  Gastrointestinal Panel by PCR , Stool  (Gastrointestinal Panel by PCR, Stool                                                                                                                                                     **Does Not include CLOSTRIDIUM DIFFICILE testing. **If CDIFF testing is needed, place order from the "C Difficile  Testing" order set.**)  Once,   URGENT        02/14/23 2103            Vitals/Pain Today's Vitals   02/14/23 2200 02/14/23 2220 02/14/23 2230 02/14/23 2247  BP:   105/63   Pulse:  (!) 124 (!) 128   Resp:  (!) 21 (!) 23   Temp:    (!) 102.1 F (38.9 C)  TempSrc:    Axillary  SpO2:  96% 97%   Weight:      Height:      PainSc: 3        Isolation Precautions Enteric precautions (UV disinfection)  Medications Medications  heparin injection 5,000 Units (has no  administration in time range)  acetaminophen (TYLENOL) tablet 650 mg (has no administration in time range)    Or  acetaminophen (TYLENOL) suppository 650 mg (has no administration in time range)  senna-docusate (Senokot-S) tablet 1 tablet (has no administration in time range)  ondansetron (ZOFRAN) injection 4 mg (has no administration in time range)  0.9 %  sodium chloride infusion ( Intravenous New Bag/Given 02/14/23 2253)  loperamide (IMODIUM) capsule 2 mg (has no administration in time range)  sodium chloride 0.9 % bolus 2,121 mL (0 mLs Intravenous Stopped 02/14/23 1734)  ondansetron (ZOFRAN) injection 4 mg (4 mg Intravenous Given 02/14/23 1550)  morphine (PF) 4 MG/ML injection 4 mg (4 mg Intravenous Given 02/14/23 1550)  sodium chloride 0.9 % bolus 1,000 mL (0 mLs Intravenous Stopped 02/14/23 1933)  iohexol (OMNIPAQUE) 350 MG/ML injection 75 mL (75 mLs Intravenous Contrast Given 02/14/23 1751)  acetaminophen (TYLENOL) tablet 650 mg (650 mg Oral Given 02/14/23 1954)  ondansetron (ZOFRAN) injection 4 mg (4 mg Intravenous Given 02/14/23 1955)  lactated ringers bolus 1,000 mL (0 mLs Intravenous Stopped 02/14/23 2253)  ketorolac (TORADOL) 15 MG/ML injection 15 mg (15 mg Intravenous Given 02/14/23 2218)  pantoprazole (PROTONIX) EC tablet 40 mg (40 mg Oral Given 02/14/23 2242)    Mobility walks     Focused Assessments Cardiac Assessment Handoff:    No results found for: "CKTOTAL", "CKMB", "CKMBINDEX", "TROPONINI" No results found for: "DDIMER" Does the Patient currently have chest pain? No    R Recommendations: See Admitting Provider Note  Report given to:   Additional Notes:

## 2023-02-14 NOTE — ED Provider Notes (Signed)
[facility] AT [facility] Provider Note  CSN: [id number] Arrival date & time: 02/14/23 1358  Chief Complaint(s) Loss of Consciousness, Emesis, and Diarrhea  HPI Russell Stephens is a 37 y.o. male with history of asthma presenting to the emergency department with diarrhea.  Patient reports diarrhea starting this morning.  Reports profuse clear diarrhea.  No blood in his stool.  Has also been vomiting and having abdominal pain.  No hematemesis.  No fevers, reports some chills.  Reports that he also had a syncopal episode, was feeling weak and lightheaded, stood up to go to the bathroom and then lost consciousness.  His wife helped him to the floor.  It was momentary and he was at his baseline afterwards.  Did have fecal incontinence during episode of loss of consciousness.  Reports abdominal pain is diffuse.  No sick contacts.  No recent travel.,  No recent antibiotics.  He did take Imodium prior to arriving at the emergency department reports the diarrhea has resolved.   Past Medical History No past medical history on file. Patient Active Problem List   Diagnosis Date Noted   Gastroenteritis 02/14/2023   AKI (acute kidney injury) (HCC) 02/14/2023   Syncope and collapse 02/14/2023   Home Medication(s) Prior to Admission medications   Medication Sig Start Date End Date Taking? Authorizing Provider  albuterol (VENTOLIN HFA) 108 (90 Base) MCG/ACT inhaler Inhale 2 puffs into the lungs every 6 (six) hours as needed for wheezing or shortness of breath.    [provider]  Pseudoephedrine-APAP-DM (DAYQUIL PO) Take 30 mLs by mouth every 4 (four) hours as needed (cold and flu).    [provider]                                                                                                                                    Past Surgical History Past Surgical History:  Procedure Laterality Date   HERNIA REPAIR     Family History No family history on  file.  Social History Social History   Tobacco Use   Smoking status: Never   Smokeless tobacco: Never  Substance Use Topics   Alcohol use: Yes   Drug use: Not Currently   Allergies Dairy aid [tilactase]  Review of Systems Review of Systems  All other systems reviewed and are negative.   Physical Exam Vital Signs  I have reviewed the triage vital signs BP 105/63   Pulse (!) 128   Temp (!) 102.1 F (38.9 C) (Axillary)   Resp (!) 23   Ht 5\' 9"  (1.753 m)   Wt 113.4 kg   SpO2 97%   BMI 36.92 kg/m  Physical Exam Vitals and nursing note reviewed.  Constitutional:      General: He is not in acute distress.    Appearance: Normal appearance.  HENT:     Mouth/Throat:     Mouth: Mucous membranes are  moist.  Eyes:     Conjunctiva/sclera: Conjunctivae normal.  Cardiovascular:     Rate and Rhythm: Normal rate and regular rhythm.  Pulmonary:     Effort: Pulmonary effort is normal. No respiratory distress.     Breath sounds: Normal breath sounds.  Abdominal:     General: Abdomen is flat. There is no distension.     Palpations: Abdomen is soft.     Tenderness: There is abdominal tenderness (generalized).  Musculoskeletal:     Right lower leg: No edema.     Left lower leg: No edema.  Skin:    General: Skin is warm and dry.     Capillary Refill: Capillary refill takes less than 2 seconds.  Neurological:     Mental Status: He is alert and oriented to person, place, and time. Mental status is at baseline.  Psychiatric:        Mood and Affect: Mood normal.        Behavior: Behavior normal.     ED Results and Treatments Labs (all labs ordered are listed, but only abnormal results are displayed) Labs Reviewed  BASIC METABOLIC PANEL - Abnormal; Notable for the following components:      Result Value   CO2 19 (*)    Glucose, Bld 144 (*)    BUN 21 (*)    Creatinine, Ser 1.91 (*)    GFR, Estimated 46 (*)    All other components within normal limits  CBC - Abnormal;  Notable for the following components:   WBC 19.4 (*)    RBC 6.03 (*)    HCT 53.1 (*)    All other components within normal limits  URINALYSIS, ROUTINE W REFLEX MICROSCOPIC - Abnormal; Notable for the following components:   Color, Urine AMBER (*)    APPearance HAZY (*)    Specific Gravity, Urine >1.046 (*)    Hgb urine dipstick SMALL (*)    Protein, ur 100 (*)    Bacteria, UA FEW (*)    All other components within normal limits  CBG MONITORING, ED - Abnormal; Notable for the following components:   Glucose-Capillary 166 (*)    All other components within normal limits  RESP PANEL BY RT-PCR (RSV, FLU A&B, COVID)  RVPGX2  GASTROINTESTINAL PANEL BY PCR, STOOL (REPLACES STOOL CULTURE)  C DIFFICILE QUICK SCREEN W PCR REFLEX    CBC  CREATININE, SERUM  BASIC METABOLIC PANEL  CBC WITH DIFFERENTIAL/PLATELET  MAGNESIUM                                                                                                                          Radiology CT ABDOMEN PELVIS W CONTRAST  Result Date: 02/14/2023 CLINICAL DATA:  Diarrhea and emesis EXAM: CT ABDOMEN AND PELVIS WITH CONTRAST TECHNIQUE: Multidetector CT imaging of the abdomen and pelvis was performed using the standard protocol following bolus administration of intravenous contrast. RADIATION DOSE REDUCTION: This exam was performed according to the departmental dose-optimization program which  includes automated exposure control, adjustment of the mA and/or kV according to patient size and/or use of iterative reconstruction technique. CONTRAST:  75mL OMNIPAQUE IOHEXOL 350 MG/ML SOLN COMPARISON:  CT report 04/17/2001 FINDINGS: Lower chest: Lung bases are clear Hepatobiliary: No focal liver abnormality is seen. No gallstones, gallbladder wall thickening, or biliary dilatation. Pancreas: Unremarkable. No pancreatic ductal dilatation or surrounding inflammatory changes. Spleen: Normal in size without focal abnormality. Adrenals/Urinary Tract: Adrenal  glands are unremarkable. Kidneys are normal, without renal calculi, focal lesion, or hydronephrosis. Bladder is unremarkable. Stomach/Bowel: Mild fluid distension of stomach. Small hiatal hernia diffuse fluid-filled small and large bowel with some proximal small bowel distension up to 3.3 cm but no well-defined transition point. Mild small bowel wall thickening in the right upper quadrant, for example series 2, image 38. Negative appendix. Vascular/Lymphatic: No significant vascular findings are present. No enlarged abdominal or pelvic lymph nodes. Reproductive: Prostate is unremarkable. Other: Negative for pelvic effusion or free air. Small fat containing left inguinal hernia Musculoskeletal: No acute or significant osseous findings. IMPRESSION: 1. Diffuse fluid-filled small and large bowel with some proximal small bowel distension but no well-defined transition point. Mild areas of small bowel wall thickening. Findings are suggestive of enteritis/diarrheal illness. No convincing obstruction 2. Small hiatal hernia. Electronically Signed   By: Jasmine Pang M.D.   On: 02/14/2023 21:20    Pertinent labs & imaging results that were available during my care of the patient were reviewed by me and considered in my medical decision making (see MDM for details).  Medications Ordered in ED Medications  heparin injection 5,000 Units (has no administration in time range)  acetaminophen (TYLENOL) tablet 650 mg (has no administration in time range)    Or  acetaminophen (TYLENOL) suppository 650 mg (has no administration in time range)  senna-docusate (Senokot-S) tablet 1 tablet (has no administration in time range)  ondansetron (ZOFRAN) injection 4 mg (has no administration in time range)  0.9 %  sodium chloride infusion (has no administration in time range)  loperamide (IMODIUM) capsule 2 mg (has no administration in time range)  sodium chloride 0.9 % bolus 2,121 mL (0 mLs Intravenous Stopped 02/14/23 1734)   ondansetron (ZOFRAN) injection 4 mg (4 mg Intravenous Given 02/14/23 1550)  morphine (PF) 4 MG/ML injection 4 mg (4 mg Intravenous Given 02/14/23 1550)  sodium chloride 0.9 % bolus 1,000 mL (0 mLs Intravenous Stopped 02/14/23 1933)  iohexol (OMNIPAQUE) 350 MG/ML injection 75 mL (75 mLs Intravenous Contrast Given 02/14/23 1751)  acetaminophen (TYLENOL) tablet 650 mg (650 mg Oral Given 02/14/23 1954)  ondansetron (ZOFRAN) injection 4 mg (4 mg Intravenous Given 02/14/23 1955)  lactated ringers bolus 1,000 mL (1,000 mLs Intravenous New Bag/Given 02/14/23 2123)  ketorolac (TORADOL) 15 MG/ML injection 15 mg (15 mg Intravenous Given 02/14/23 2218)  pantoprazole (PROTONIX) EC tablet 40 mg (40 mg Oral Given 02/14/23 2242)  Procedures Procedures  (including critical care time)  Medical Decision Making / ED Course   MDM:  37 year old presenting to the emergency department with diarrhea.  Patient overall well-appearing, vitals with mild tachycardia.  Suspect infectious enteritis.  No blood in the stool to suggest invasive diarrhea.  Labs notable for AKI.  Patient also with leukocytosis to 19.  He does have tenderness on exam, lower concern for any acute process such as colitis, abscess, volvulus, obstruction, perforation, and leukocytosis is most likely related to dehydration, but will check CT scan.  Patient did have episode of syncope, seems most likely related to dehydration, with a orthostatic or also potentially vasovagal component given patient had urge to defecate.  Very low concern for cardiogenic cause.  He sustained no trauma from his fall.  Will reassess after fluid resuscitation.  Clinical Course as of 02/14/23 2248  Mon Feb 14, 2023  2247 Patient with tachycardia, fevers.  He received a significant amount of fluid but is still tachycardic.  His CT shows no acute  findings other than diarrheal illness.  Send stool studies.  Given persistent tachycardia as well as AKI, discussed with hospitalist Dr. Haroldine Laws was admitted the patient. [WS]    Clinical Course User Index [WS] Lonell Grandchild, MD     Additional history obtained: -Additional history obtained from ems -External records from outside source obtained and reviewed including: Chart review including previous notes, labs, imaging, consultation notes including ***   Lab Tests: -I ordered, reviewed, and interpreted labs.   The pertinent results include:   Labs Reviewed  BASIC METABOLIC PANEL - Abnormal; Notable for the following components:      Result Value   CO2 19 (*)    Glucose, Bld 144 (*)    BUN 21 (*)    Creatinine, Ser 1.91 (*)    GFR, Estimated 46 (*)    All other components within normal limits  CBC - Abnormal; Notable for the following components:   WBC 19.4 (*)    RBC 6.03 (*)    HCT 53.1 (*)    All other components within normal limits  URINALYSIS, ROUTINE W REFLEX MICROSCOPIC - Abnormal; Notable for the following components:   Color, Urine AMBER (*)    APPearance HAZY (*)    Specific Gravity, Urine >1.046 (*)    Hgb urine dipstick SMALL (*)    Protein, ur 100 (*)    Bacteria, UA FEW (*)    All other components within normal limits  CBG MONITORING, ED - Abnormal; Notable for the following components:   Glucose-Capillary 166 (*)    All other components within normal limits  RESP PANEL BY RT-PCR (RSV, FLU A&B, COVID)  RVPGX2  GASTROINTESTINAL PANEL BY PCR, STOOL (REPLACES STOOL CULTURE)  C DIFFICILE QUICK SCREEN W PCR REFLEX    CBC  CREATININE, SERUM  BASIC METABOLIC PANEL  CBC WITH DIFFERENTIAL/PLATELET  MAGNESIUM    Notable for ***  EKG   EKG Interpretation Date/Time:  Monday February 14 2023 13:51:23 EST Ventricular Rate:  108 PR Interval:  148 QRS Duration:  88 QT Interval:  314 QTC Calculation: 420 R Axis:   70  Text Interpretation: Sinus  tachycardia Abnormal ECG No previous ECGs available Confirmed by Alvino Blood ([id number]) on 02/14/2023 3:53:24 PM         Imaging Studies ordered: I ordered imaging studies including *** On my interpretation imaging demonstrates *** I independently visualized and interpreted imaging. I agree with the radiologist interpretation   Medicines  ordered and prescription drug management: Meds ordered this encounter  Medications   sodium chloride 0.9 % bolus 2,121 mL   ondansetron (ZOFRAN) injection 4 mg   morphine (PF) 4 MG/ML injection 4 mg   sodium chloride 0.9 % bolus 1,000 mL   iohexol (OMNIPAQUE) 350 MG/ML injection 75 mL   acetaminophen (TYLENOL) tablet 650 mg   ondansetron (ZOFRAN) injection 4 mg   lactated ringers bolus 1,000 mL   ketorolac (TORADOL) 15 MG/ML injection 15 mg   heparin injection 5,000 Units   OR Linked Order Group    acetaminophen (TYLENOL) tablet 650 mg    acetaminophen (TYLENOL) suppository 650 mg   senna-docusate (Senokot-S) tablet 1 tablet   pantoprazole (PROTONIX) EC tablet 40 mg   ondansetron (ZOFRAN) injection 4 mg   0.9 %  sodium chloride infusion   loperamide (IMODIUM) capsule 2 mg    -I have reviewed the patients home medicines and have made adjustments as needed   Consultations Obtained: I requested consultation with the ***,  and discussed lab and imaging findings as well as pertinent plan - they recommend: ***   Cardiac Monitoring: The patient was maintained on a cardiac monitor.  I personally viewed and interpreted the cardiac monitored which showed an underlying rhythm of: ***  Social Determinants of Health:  Diagnosis or treatment significantly limited by social determinants of health: {wssoc:28071}   Reevaluation: After the interventions noted above, I reevaluated the patient and found that their symptoms have {resolved/improved/worsened:23923::"improved"}  Co morbidities that complicate the patient evaluation No past medical  history on file.    Dispostion: Disposition decision including need for hospitalization was considered, and patient {wsdispo:28070::"discharged from emergency department."}    Final Clinical Impression(s) / ED Diagnoses Final diagnoses:  Dehydration  Gastroenteritis     This chart was dictated using voice recognition software.  Despite best efforts to proofread,  errors can occur which can change the documentation meaning.

## 2023-02-14 NOTE — H&P (Signed)
PCP:   Kaleen Mask, MD   Chief Complaint:  Syncope and collapse, nausea, vomiting, diarrhea.  HPI: This is a 37 year old male with no significant past medical history.  Per patient today around 5 AM he developed severe nausea, vomiting, diarrhea.  He also endorses generalized abdominal pain.  He states his diarrhea was to the point he became lightheaded, dizzy and suffered a loss of consciousness.  He did not hit his head as his wife was able to break his fall.  Denies any blood in his stool.  He is going almost every 15 minutes.  Around noon he took a dose of Imodium.  His diarrhea persists, is liquid but has improved.  He denies any recent antibiotic use.  He denies any sick contacts.  He believes his food poisoning based on need he had for breakfast.  At home his took his temperatures Tmax was 103.  He came to the ER.  In the ER patient with borderline BPs, tachycardia up to 134, Tmax 102.9. Creatinine 1.91 [baseline normal], WBCs 19.4, respiratory panel negative.  C. difficile toxins done and negative.  CT abdomen pelvis consistent with diarrhea.  Patient has been given a total of 4 L IV fluid bolus.  Admission requested.   Review of Systems:  Per HPI  Past Medical History: None  Past Surgical History:  Procedure Laterality Date   HERNIA REPAIR      Medications: Prior to Admission medications   Medication Sig Start Date End Date Taking? Authorizing Provider  albuterol (VENTOLIN HFA) 108 (90 Base) MCG/ACT inhaler Inhale 2 puffs into the lungs every 6 (six) hours as needed for wheezing or shortness of breath.    [provider]  Pseudoephedrine-APAP-DM (DAYQUIL PO) Take 30 mLs by mouth every 4 (four) hours as needed (cold and flu).    [provider]    Allergies:   Allergies  Allergen Reactions   Dairy Aid [Tilactase] Other (See Comments)    Upset stomach; "tears me up"    Social History:  reports that he has never smoked. He has never used  smokeless tobacco. He reports current alcohol use. He reports that he does not currently use drugs.  Family History: HTN  Physical Exam: Vitals:   02/14/23 2100 02/14/23 2116 02/14/23 2200 02/14/23 2220  BP: 113/76  109/80   Pulse: (!) 134  (!) 124 (!) 124  Resp: 20  17 (!) 21  Temp:  (!) 102.9 F (39.4 C)    TempSrc:  Oral    SpO2: 96%  97% 96%  Weight:      Height:        General: A&O x 3, three, well developed and nourished, no acute distress Eyes: PERRLA, pink conjunctiva, no scleral icterus ENT: Moist oral mucosa, neck supple, no thyromegaly Lungs: CTA B/L, no wheeze, no crackles, no use of accessory muscles Cardiovascular: Tachycardia, RRR, no regurgitation, no gallops, no murmurs. No carotid bruits, no JVD Abdomen: soft, positive BS, nonspecific generalized TTP, not an acute abdomen GU: not examined Neuro: CN II - XII grossly intact, sensation intact Musculoskeletal: strength 5/5 all extremities, no clubbing, cyanosis or edema Skin: no rash, no subcutaneous crepitation, no decubitus Psych: appropriate patient  Labs on Admission:  Recent Labs    02/14/23 1409  NA 136  K 4.2  CL 106  CO2 19*  GLUCOSE 144*  BUN 21*  CREATININE 1.91*  CALCIUM 9.8   Recent Labs    02/14/23 1409  WBC 19.4*  HGB  16.9  HCT 53.1*  MCV 88.1  PLT 253    Radiological Exams on Admission: CT ABDOMEN PELVIS W CONTRAST  Result Date: 02/14/2023 CLINICAL DATA:  Diarrhea and emesis EXAM: CT ABDOMEN AND PELVIS WITH CONTRAST TECHNIQUE: Multidetector CT imaging of the abdomen and pelvis was performed using the standard protocol following bolus administration of intravenous contrast. RADIATION DOSE REDUCTION: This exam was performed according to the departmental dose-optimization program which includes automated exposure control, adjustment of the mA and/or kV according to patient size and/or use of iterative reconstruction technique. CONTRAST:  75mL OMNIPAQUE IOHEXOL 350 MG/ML SOLN  COMPARISON:  CT report 04/17/2001 FINDINGS: Lower chest: Lung bases are clear Hepatobiliary: No focal liver abnormality is seen. No gallstones, gallbladder wall thickening, or biliary dilatation. Pancreas: Unremarkable. No pancreatic ductal dilatation or surrounding inflammatory changes. Spleen: Normal in size without focal abnormality. Adrenals/Urinary Tract: Adrenal glands are unremarkable. Kidneys are normal, without renal calculi, focal lesion, or hydronephrosis. Bladder is unremarkable. Stomach/Bowel: Mild fluid distension of stomach. Small hiatal hernia diffuse fluid-filled small and large bowel with some proximal small bowel distension up to 3.3 cm but no well-defined transition point. Mild small bowel wall thickening in the right upper quadrant, for example series 2, image 38. Negative appendix. Vascular/Lymphatic: No significant vascular findings are present. No enlarged abdominal or pelvic lymph nodes. Reproductive: Prostate is unremarkable. Other: Negative for pelvic effusion or free air. Small fat containing left inguinal hernia Musculoskeletal: No acute or significant osseous findings. IMPRESSION: 1. Diffuse fluid-filled small and large bowel with some proximal small bowel distension but no well-defined transition point. Mild areas of small bowel wall thickening. Findings are suggestive of enteritis/diarrheal illness. No convincing obstruction 2. Small hiatal hernia. Electronically Signed   By: Jasmine Pang M.D.   On: 02/14/2023 21:20    Assessment/Plan Present on Admission: Gastroenteritis/syncope and collapse/AKI/profound dehydration -Syncope and collapse secondary to the orthostatic hypotension -Aggressive IV fluid hydration initiated in ER.  LR IV fluid infusion continuous on floor -GI panel, C. difficile toxin ordered.  C. difficile negative.  Imodium as needed ordered -Zofran as needed nausea vomiting -Likely self-limiting.  Await results of GI panel -Tylenol as needed fever  Teara Duerksen,  Trevis Eden 02/14/2023, 10:23 PM

## 2023-02-15 ENCOUNTER — Encounter (HOSPITAL_COMMUNITY): Payer: Self-pay | Admitting: Family Medicine

## 2023-02-15 DIAGNOSIS — J45909 Unspecified asthma, uncomplicated: Secondary | ICD-10-CM | POA: Diagnosis present

## 2023-02-15 DIAGNOSIS — Z1152 Encounter for screening for COVID-19: Secondary | ICD-10-CM | POA: Diagnosis not present

## 2023-02-15 DIAGNOSIS — E66812 Obesity, class 2: Secondary | ICD-10-CM | POA: Diagnosis present

## 2023-02-15 DIAGNOSIS — N179 Acute kidney failure, unspecified: Secondary | ICD-10-CM | POA: Diagnosis present

## 2023-02-15 DIAGNOSIS — K529 Noninfective gastroenteritis and colitis, unspecified: Secondary | ICD-10-CM | POA: Diagnosis present

## 2023-02-15 DIAGNOSIS — E861 Hypovolemia: Secondary | ICD-10-CM | POA: Diagnosis present

## 2023-02-15 DIAGNOSIS — A059 Bacterial foodborne intoxication, unspecified: Secondary | ICD-10-CM | POA: Diagnosis present

## 2023-02-15 DIAGNOSIS — R112 Nausea with vomiting, unspecified: Secondary | ICD-10-CM | POA: Diagnosis present

## 2023-02-15 DIAGNOSIS — Z8249 Family history of ischemic heart disease and other diseases of the circulatory system: Secondary | ICD-10-CM | POA: Diagnosis not present

## 2023-02-15 DIAGNOSIS — Z6836 Body mass index (BMI) 36.0-36.9, adult: Secondary | ICD-10-CM | POA: Diagnosis not present

## 2023-02-15 DIAGNOSIS — E860 Dehydration: Secondary | ICD-10-CM | POA: Diagnosis present

## 2023-02-15 DIAGNOSIS — I951 Orthostatic hypotension: Secondary | ICD-10-CM | POA: Diagnosis present

## 2023-02-15 LAB — MAGNESIUM: Magnesium: 1.2 mg/dL — ABNORMAL LOW (ref 1.7–2.4)

## 2023-02-15 LAB — GASTROINTESTINAL PANEL BY PCR, STOOL (REPLACES STOOL CULTURE)

## 2023-02-15 LAB — C DIFFICILE QUICK SCREEN W PCR REFLEX
C Diff antigen: NEGATIVE
C Diff interpretation: NOT DETECTED
C Diff toxin: NEGATIVE

## 2023-02-15 LAB — CBC WITH DIFFERENTIAL/PLATELET
Abs Immature Granulocytes: 0.06 10*3/uL (ref 0.00–0.07)
Basophils Absolute: 0 10*3/uL (ref 0.0–0.1)
Basophils Relative: 0 %
Eosinophils Absolute: 0 10*3/uL (ref 0.0–0.5)
Eosinophils Relative: 0 %
HCT: 44.3 % (ref 39.0–52.0)
Hemoglobin: 14.1 g/dL (ref 13.0–17.0)
Immature Granulocytes: 1 %
Lymphocytes Relative: 4 %
Lymphs Abs: 0.6 10*3/uL — ABNORMAL LOW (ref 0.7–4.0)
MCH: 27.5 pg (ref 26.0–34.0)
MCHC: 31.8 g/dL (ref 30.0–36.0)
MCV: 86.4 fL (ref 80.0–100.0)
Monocytes Absolute: 1.4 10*3/uL — ABNORMAL HIGH (ref 0.1–1.0)
Monocytes Relative: 11 %
Neutro Abs: 10.7 10*3/uL — ABNORMAL HIGH (ref 1.7–7.7)
Neutrophils Relative %: 84 %
Platelets: 166 10*3/uL (ref 150–400)
RBC: 5.13 MIL/uL (ref 4.22–5.81)
RDW: 14.3 % (ref 11.5–15.5)
WBC: 12.8 10*3/uL — ABNORMAL HIGH (ref 4.0–10.5)
nRBC: 0 % (ref 0.0–0.2)

## 2023-02-15 LAB — BASIC METABOLIC PANEL
Anion gap: 8 (ref 5–15)
BUN: 19 mg/dL (ref 6–20)
CO2: 18 mmol/L — ABNORMAL LOW (ref 22–32)
Calcium: 7.8 mg/dL — ABNORMAL LOW (ref 8.9–10.3)
Chloride: 111 mmol/L (ref 98–111)
Creatinine, Ser: 1.31 mg/dL — ABNORMAL HIGH (ref 0.61–1.24)
GFR, Estimated: >60 mL/min (ref >60)
Glucose, Bld: 124 mg/dL — ABNORMAL HIGH (ref 70–99)
Potassium: 3.6 mmol/L (ref 3.5–5.1)
Sodium: 137 mmol/L (ref 135–145)

## 2023-02-15 LAB — GLUCOSE, CAPILLARY: Glucose-Capillary: 149 mg/dL — ABNORMAL HIGH (ref 70–99)

## 2023-02-15 MED ORDER — CALCIUM CARBONATE ANTACID 500 MG PO CHEW
1.0000 | CHEWABLE_TABLET | Freq: Three times a day (TID) | ORAL | Status: DC | PRN
  Administered 2023-02-15: 200 mg via ORAL
  Filled 2023-02-15: qty 1

## 2023-02-15 MED ORDER — HYDROMORPHONE HCL 1 MG/ML IJ SOLN
0.5000 mg | INTRAMUSCULAR | Status: DC | PRN
  Administered 2023-02-15 – 2023-02-17 (×9): 0.5 mg via INTRAVENOUS
  Filled 2023-02-15 (×10): qty 1

## 2023-02-15 MED ORDER — SODIUM CHLORIDE 0.9 % IV SOLN
12.5000 mg | Freq: Four times a day (QID) | INTRAVENOUS | Status: DC | PRN
  Administered 2023-02-15: 12.5 mg via INTRAVENOUS
  Filled 2023-02-15 (×2): qty 0.5

## 2023-02-15 NOTE — Progress Notes (Signed)
PROGRESS NOTE    Russell Stephens  ZOX:[id number] DOB: [birth year] DOA: 02/14/2023 PCP: Kaleen Mask, MD   Brief Narrative:  This is a 37 year old male with no significant past medical history.  Acute onset of severe nausea, vomiting, diarrhea, and abdominal pain just prior to admission.  Noted AKI on initial labs, reported episode of syncope.   Assessment & Plan:   Principal Problem:   AKI (acute kidney injury) (HCC) Active Problems:   Gastroenteritis   Syncope and collapse   Acute intractable abdominal pain, nausea, vomiting Secondary to gastroenteritis -Continues to have episodes of nausea with vomiting despite Zofran as well as continuing diarrhea. -Continue IV fluids until p.o. intake is appropriate, suspect viral gastroenteritis, no other sick contacts or recent travel -GI viral panel negative, C. difficile negative, respiratory panel negative  Episode of syncope with collapse, resolved likely orthostatic/hypovolemic in nature -Likely secondary to profound hypovolemia, dehydration and likely orthostatic hypotension -Resolved with IV fluids, no episodes noted after admission; symptoms appear to have resolved  AKI, prerenal with profound dehydration and hypovolemia -On IV fluids given above, creatinine already downtrending back to normal  DVT prophylaxis: heparin injection 5,000 Units Start: 02/15/23 0600   Code Status:   Code Status: Full Code  Family Communication: None present  Status is: Inpatient  Dispo: The patient is from: Home              Anticipated d/c is to: Home              Anticipated d/c date is: 24 to 48 hours              Patient currently not medically stable for discharge given patient is unable to tolerate p.o. safely, continues to have diarrhea with ongoing hypovolemia Consultants:  None  Procedures:  None  Antimicrobials:  None  Subjective: No acute issues or events overnight denies headache chest pain shortness of breath fever  chills; diarrhea and vomiting ongoing with minimal improvement  Objective: Vitals:   02/14/23 2330 02/15/23 0051 02/15/23 0159 02/15/23 0615  BP: 107/71 112/80 133/83 121/87  Pulse: (!) 115 (!) 116 (!) 117 (!) 115  Resp: (!) 23 18 18 18   Temp: 100 F (37.8 C) 100.1 F (37.8 C) 99.5 F (37.5 C) 98.6 F (37 C)  TempSrc:      SpO2: 94% 99% 97% 97%  Weight:      Height:        Intake/Output Summary (Last 24 hours) at 02/15/2023 0720 Last data filed at 02/15/2023 8119 Gross per 24 hour  Intake 1550.52 ml  Output 0 ml  Net 1550.52 ml   Filed Weights   02/14/23 1405  Weight: 113.4 kg    Examination:  General:  Pleasantly resting in bed, No acute distress. HEENT:  Normocephalic atraumatic.  Sclerae nonicteric, noninjected.  Extraocular movements intact bilaterally. Neck:  Without mass or deformity.  Trachea is midline. Lungs:  Clear to auscultate bilaterally without rhonchi, wheeze, or rales. Heart:  Regular rate and rhythm.  Without murmurs, rubs, or gallops. Abdomen:  Soft, nontender, nondistended.  Without guarding or rebound. Extremities: Without cyanosis, clubbing, edema, or obvious deformity. Skin:  Warm and dry, no erythema.  Data Reviewed: I have personally reviewed following labs and imaging studies  CBC: Recent Labs  Lab 02/14/23 1409 02/15/23 0350  WBC 19.4* 12.8*  NEUTROABS  --  10.7*  HGB 16.9 14.1  HCT 53.1* 44.3  MCV 88.1 86.4  PLT 253 166   Basic Metabolic  Panel: Recent Labs  Lab 02/14/23 1409 02/15/23 0350  NA 136 137  K 4.2 3.6  CL 106 111  CO2 19* 18*  GLUCOSE 144* 124*  BUN 21* 19  CREATININE 1.91* 1.31*  CALCIUM 9.8 7.8*  MG  --  1.2*   GFR: Estimated Creatinine Clearance: 96.8 mL/min (A) (by C-G formula based on SCr of 1.31 mg/dL (H)).  CBG: Recent Labs  Lab 02/14/23 1414  GLUCAP 166*   Recent Results (from the past 240 hour(s))  Resp panel by RT-PCR (RSV, Flu A&B, Covid)     Status: None   Collection Time: 02/14/23  9:19  PM   Specimen: Nasal Swab  Result Value Ref Range Status   SARS Coronavirus 2 by RT PCR NEGATIVE NEGATIVE Final   Influenza A by PCR NEGATIVE NEGATIVE Final   Influenza B by PCR NEGATIVE NEGATIVE Final    Comment: (NOTE) The Xpert Xpress SARS-CoV-2/FLU/RSV plus assay is intended as an aid in the diagnosis of influenza from Nasopharyngeal swab specimens and should not be used as a sole basis for treatment. Nasal washings and aspirates are unacceptable for Xpert Xpress SARS-CoV-2/FLU/RSV testing.  Fact Sheet for Patients: BloggerCourse.com  Fact Sheet for Healthcare Providers: SeriousBroker.it  This test is not yet approved or cleared by the [place] FDA and has been authorized for detection and/or diagnosis of SARS-CoV-2 by FDA under an Emergency Use Authorization (EUA). This EUA will remain in effect (meaning this test can be used) for the duration of the COVID-19 declaration under Section 564(b)(1) of the Act, 21 U.S.C. section 360bbb-3(b)(1), unless the authorization is terminated or revoked.     Resp Syncytial Virus by PCR NEGATIVE NEGATIVE Final    Comment: (NOTE) Fact Sheet for Patients: BloggerCourse.com  Fact Sheet for Healthcare Providers: SeriousBroker.it  This test is not yet approved or cleared by the [place] FDA and has been authorized for detection and/or diagnosis of SARS-CoV-2 by FDA under an Emergency Use Authorization (EUA). This EUA will remain in effect (meaning this test can be used) for the duration of the COVID-19 declaration under Section 564(b)(1) of the Act, 21 U.S.C. section 360bbb-3(b)(1), unless the authorization is terminated or revoked.  Performed at [facility], [address], [place], [place] [address]   C Difficile Quick Screen w PCR reflex     Status: None   Collection Time: 02/14/23  9:30 PM   Specimen: Stool  Result  Value Ref Range Status   C Diff antigen NEGATIVE NEGATIVE Final   C Diff toxin NEGATIVE NEGATIVE Final   C Diff interpretation No C. difficile detected.  Final    Comment: Performed at [facility], [address] [place], [place] [address]     Radiology Studies: CT ABDOMEN PELVIS W CONTRAST  Result Date: 02/14/2023 CLINICAL DATA:  Diarrhea and emesis EXAM: CT ABDOMEN AND PELVIS WITH CONTRAST TECHNIQUE: Multidetector CT imaging of the abdomen and pelvis was performed using the standard protocol following bolus administration of intravenous contrast. RADIATION DOSE REDUCTION: This exam was performed according to the departmental dose-optimization program which includes automated exposure control, adjustment of the mA and/or kV according to patient size and/or use of iterative reconstruction technique. CONTRAST:  75mL OMNIPAQUE IOHEXOL 350 MG/ML SOLN COMPARISON:  CT report 04/17/2001 FINDINGS: Lower chest: Lung bases are clear Hepatobiliary: No focal liver abnormality is seen. No gallstones, gallbladder wall thickening, or biliary dilatation. Pancreas: Unremarkable. No pancreatic ductal dilatation or surrounding inflammatory changes. Spleen: Normal in size without  focal abnormality. Adrenals/Urinary Tract: Adrenal glands are unremarkable. Kidneys are normal, without renal calculi, focal lesion, or hydronephrosis. Bladder is unremarkable. Stomach/Bowel: Mild fluid distension of stomach. Small hiatal hernia diffuse fluid-filled small and large bowel with some proximal small bowel distension up to 3.3 cm but no well-defined transition point. Mild small bowel wall thickening in the right upper quadrant, for example series 2, image 38. Negative appendix. Vascular/Lymphatic: No significant vascular findings are present. No enlarged abdominal or pelvic lymph nodes. Reproductive: Prostate is unremarkable. Other: Negative for pelvic effusion or free air. Small fat containing left inguinal hernia  Musculoskeletal: No acute or significant osseous findings. IMPRESSION: 1. Diffuse fluid-filled small and large bowel with some proximal small bowel distension but no well-defined transition point. Mild areas of small bowel wall thickening. Findings are suggestive of enteritis/diarrheal illness. No convincing obstruction 2. Small hiatal hernia. Electronically Signed   By: Jasmine Pang M.D.   On: 02/14/2023 21:20        Scheduled Meds:  heparin  5,000 Units Subcutaneous Q8H   Continuous Infusions:  sodium chloride 100 mL/hr at 02/14/23 2253   promethazine (PHENERGAN) injection (IM or IVPB)       LOS: 0 days   Time spent:  Azucena Fallen, DO [facility]  If 7PM-7AM, please contact night-coverage [email or web address]  02/15/2023, 7:20 AM

## 2023-02-15 NOTE — TOC CM/SW Note (Signed)
Transition of Care [facility]) - Inpatient Brief Assessment   Patient Details  Name: Russell Stephens MRN: [id number] Date of Birth: [birth year]  Transition of Care [facility]) CM/SW Contact:    Tom-Johnson, Hershal Coria, RN Phone Number: 02/15/2023, 2:33 PM   Clinical Narrative:  Patient presented to the ED with Nausea, Vomiting, Diarrhea and Abdominal Pains. States he had a Syncopal episode at home.  Admitted with AKI. GI Panel and C-Diff negative.    From home with wife and three children Parents and siblings ar supportive. Self employed, independent with care and drive self prior to admission. Does not have DME's at home.  PCP is Kaleen Mask, MD and uses [facility] on [address] in [place].   No TOC needs or recommendations noted at this time.  Patient not Medically ready for discharge.  CM will continue to follow as patient progresses with care towards discharge.               Transition of Care Asessment: Insurance and Status: Insurance coverage has been reviewed Patient has primary care physician: Yes Home environment has been reviewed: Yes Prior level of function:: Independent Prior/Current Home Services: No current home services Social Determinants of Health Reivew: SDOH reviewed no interventions necessary Readmission risk has been reviewed: Yes Transition of care needs: no transition of care needs at this time

## 2023-02-16 DIAGNOSIS — N179 Acute kidney failure, unspecified: Secondary | ICD-10-CM | POA: Diagnosis not present

## 2023-02-16 LAB — COMPREHENSIVE METABOLIC PANEL
ALT: 52 U/L — ABNORMAL HIGH (ref 0–44)
AST: 30 U/L (ref 15–41)
Albumin: 3.4 g/dL — ABNORMAL LOW (ref 3.5–5.0)
Alkaline Phosphatase: 54 U/L (ref 38–126)
Anion gap: 8 (ref 5–15)
BUN: 18 mg/dL (ref 6–20)
CO2: 23 mmol/L (ref 22–32)
Calcium: 8.1 mg/dL — ABNORMAL LOW (ref 8.9–10.3)
Chloride: 104 mmol/L (ref 98–111)
Creatinine, Ser: 1.25 mg/dL — ABNORMAL HIGH (ref 0.61–1.24)
GFR, Estimated: >60 mL/min (ref >60)
Glucose, Bld: 115 mg/dL — ABNORMAL HIGH (ref 70–99)
Potassium: 3.5 mmol/L (ref 3.5–5.1)
Sodium: 135 mmol/L (ref 135–145)
Total Bilirubin: 0.6 mg/dL (ref <1.2)
Total Protein: 6.4 g/dL — ABNORMAL LOW (ref 6.5–8.1)

## 2023-02-16 LAB — CBC
HCT: 45.3 % (ref 39.0–52.0)
Hemoglobin: 14.5 g/dL (ref 13.0–17.0)
MCH: 27.4 pg (ref 26.0–34.0)
MCHC: 32 g/dL (ref 30.0–36.0)
MCV: 85.5 fL (ref 80.0–100.0)
Platelets: 197 10*3/uL (ref 150–400)
RBC: 5.3 MIL/uL (ref 4.22–5.81)
RDW: 14.4 % (ref 11.5–15.5)
WBC: 6.5 10*3/uL (ref 4.0–10.5)
nRBC: 0 % (ref 0.0–0.2)

## 2023-02-16 MED ORDER — LACTATED RINGERS IV SOLN: INTRAVENOUS | Status: AC

## 2023-02-16 NOTE — Plan of Care (Signed)

## 2023-02-16 NOTE — Progress Notes (Signed)
PROGRESS NOTE  Russell Stephens VFI:[id number] DOB: Jul 08, 1985 DOA: 02/14/2023 PCP: Kaleen Mask, MD   LOS: 1 day   Brief Narrative / Interim history: This is a 37 year old male with no significant past medical history.  Acute onset of severe nausea, vomiting, diarrhea, and abdominal pain just prior to admission.  Noted AKI on initial labs, reported episode of syncope.   Subjective / 24h Interval events: Still complains of intermittent diarrhea as well as intermittent nausea or vomiting.  Feels like his episodes are more spaced out  Assesement and Plan: Principal Problem:   AKI (acute kidney injury) (HCC) Active Problems:   Gastroenteritis   Syncope and collapse   Gastroenteritis, acute  Principal problem Acute intractable abdominal pain, nausea, vomiting, diarrhea-likely due to gastroenteritis as evidenced on the CT scan on admission.  Possibly food induced.  GI pathogen panel negative, C. 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, he would benefit from weight loss  Scheduled Meds:  heparin  5,000 Units Subcutaneous Q8H   Continuous Infusions:  lactated ringers 100 mL/hr at 02/16/23 1022   promethazine (PHENERGAN) injection (IM or IVPB) Stopped (02/15/23 1456)   PRN Meds:.acetaminophen **OR** acetaminophen, calcium carbonate, HYDROmorphone (DILAUDID) injection, loperamide, ondansetron (ZOFRAN) IV, promethazine (PHENERGAN) injection (IM or IVPB)  No current outpatient medications  Diet Orders (From admission, onward)     Start     Ordered   02/15/23 0730  Diet full liquid Fluid consistency: Thin  Diet effective now       Question:  Fluid consistency:  Answer:  Thin   02/15/23 0729            DVT prophylaxis:  heparin injection 5,000 Units Start: 02/15/23 0600   Lab Results  Component Value Date   PLT 197 02/16/2023      Code Status: Full Code  Family Communication: no family at bedside   Status is: Inpatient Remains inpatient appropriate because: severity of illness  Level of care: Med-Surg  Consultants:  none  Objective: Vitals:   02/15/23 1647 02/15/23 2145 02/16/23 0604 02/16/23 0843  BP: (!) 133/96 132/81 126/86 126/88  Pulse: (!) 104 97 82 86  Resp: 18 18 18 18   Temp: 98.5 F (36.9 C) 98.4 F (36.9 C) 98.3 F (36.8 C) 98.4 F (36.9 C)  TempSrc:      SpO2: 97% 96% 94% 97%  Weight:      Height:        Intake/Output Summary (Last 24 hours) at 02/16/2023 1142 Last data filed at 02/16/2023 1023 Gross per 24 hour  Intake 1670.8 ml  Output 0 ml  Net 1670.8 ml   Wt Readings from Last 3 Encounters:  02/14/23 113.4 kg  08/06/20 104.3 kg    Examination:  Constitutional: NAD Eyes: no scleral icterus ENMT: Mucous membranes are moist.  Neck: normal, supple Respiratory: clear to auscultation bilaterally, no wheezing, no crackles.  Cardiovascular: Regular rate and rhythm, no murmurs / rubs / gallops. No LE edema.  Abdomen: non distended, no tenderness. Bowel sounds positive.  Musculoskeletal: no clubbing / cyanosis.    Data Reviewed: I have independently reviewed following labs and imaging studies   CBC Recent Labs  Lab 02/14/23 1409 02/15/23 0350 02/16/23 0349  WBC 19.4* 12.8* 6.5  HGB 16.9 14.1 14.5  HCT 53.1* 44.3 45.3  PLT 253 166 197  MCV 88.1 86.4 85.5  MCH 28.0 27.5 27.4  MCHC 31.8 31.8 32.0  RDW 13.8 14.3 14.4  LYMPHSABS  --  0.6*  --   MONOABS  --  1.4*  --   EOSABS  --  0.0  --   BASOSABS  --  0.0  --     Recent Labs  Lab 02/14/23 1409 02/15/23 0350 02/16/23 0349  NA 136 137 135  K 4.2 3.6 3.5  CL 106 111 104  CO2 19* 18* 23  GLUCOSE 144* 124* 115*  BUN 21* 19 18  CREATININE 1.91* 1.31* 1.25*  CALCIUM 9.8 7.8* 8.1*  AST  --   --   30  ALT  --   --  52*  ALKPHOS  --   --  54  BILITOT  --   --  0.6  ALBUMIN  --   --  3.4*  MG  --  1.2*  --     ------------------------------------------------------------------------------------------------------------------ No results for input(s): "CHOL", "HDL", "LDLCALC", "TRIG", "CHOLHDL", "LDLDIRECT" in the last 72 hours.  No results found for: "HGBA1C" ------------------------------------------------------------------------------------------------------------------ No results for input(s): "TSH", "T4TOTAL", "T3FREE", "THYROIDAB" in the last 72 hours.  Invalid input(s): "FREET3"  Cardiac Enzymes No results for input(s): "CKMB", "TROPONINI", "MYOGLOBIN" in the last 168 hours.  Invalid input(s): "CK" ------------------------------------------------------------------------------------------------------------------ No results found for: "BNP"  CBG: Recent Labs  Lab 02/14/23 1414 02/15/23 0802  GLUCAP 166* 149*    Recent Results (from the past 240 hour(s))  Gastrointestinal Panel by PCR , Stool     Status: None   Collection Time: 02/14/23  9:19 PM   Specimen: Stool  Result Value Ref Range Status   Campylobacter species NOT DETECTED NOT DETECTED Final   Plesimonas shigelloides NOT DETECTED NOT DETECTED Final   Salmonella species NOT DETECTED NOT DETECTED Final   Yersinia enterocolitica NOT DETECTED NOT DETECTED Final   Vibrio species NOT DETECTED NOT DETECTED Final   Vibrio cholerae NOT DETECTED NOT DETECTED Final   Enteroaggregative E coli (EAEC) NOT DETECTED NOT DETECTED Final   Enteropathogenic E coli (EPEC) NOT DETECTED NOT DETECTED Final   Enterotoxigenic E coli (ETEC) NOT DETECTED NOT DETECTED Final   Shiga like toxin producing E coli (STEC) NOT DETECTED NOT DETECTED Final   Shigella/Enteroinvasive E coli (EIEC) NOT DETECTED NOT DETECTED Final   Cryptosporidium NOT DETECTED NOT DETECTED Final   Cyclospora cayetanensis NOT DETECTED NOT DETECTED Final    Entamoeba histolytica NOT DETECTED NOT DETECTED Final   Giardia lamblia NOT DETECTED NOT DETECTED Final   Adenovirus F40/41 NOT DETECTED NOT DETECTED Final   Astrovirus NOT DETECTED NOT DETECTED Final   Norovirus GI/GII NOT DETECTED NOT DETECTED Final   Rotavirus A NOT DETECTED NOT DETECTED Final   Sapovirus (I, II, IV, and V) NOT DETECTED NOT DETECTED Final    Comment: Performed at [facility], [address] [place], [place] [address]  Resp panel by RT-PCR (RSV, Flu A&B, Covid)     Status: None   Collection Time: 02/14/23  9:19 PM   Specimen: Nasal Swab  Result Value Ref Range Status   SARS Coronavirus 2 by RT PCR NEGATIVE NEGATIVE Final   Influenza A by PCR NEGATIVE NEGATIVE Final   Influenza B by PCR NEGATIVE NEGATIVE Final    Comment: (NOTE) The Xpert Xpress SARS-CoV-2/FLU/RSV plus assay is intended as an aid in the diagnosis of influenza from Nasopharyngeal swab specimens and should not be used as a sole basis  for treatment. Nasal washings and aspirates are unacceptable for Xpert Xpress SARS-CoV-2/FLU/RSV testing.  Fact Sheet for Patients: BloggerCourse.com  Fact Sheet for Healthcare Providers: SeriousBroker.it  This test is not yet approved or cleared by the [place] FDA and has been authorized for detection and/or diagnosis of SARS-CoV-2 by FDA under an Emergency Use Authorization (EUA). This EUA will remain in effect (meaning this test can be used) for the duration of the COVID-19 declaration under Section 564(b)(1) of the Act, 21 U.S.C. section 360bbb-3(b)(1), unless the authorization is terminated or revoked.     Resp Syncytial Virus by PCR NEGATIVE NEGATIVE Final    Comment: (NOTE) Fact Sheet for Patients: BloggerCourse.com  Fact Sheet for Healthcare Providers: SeriousBroker.it  This test is not yet approved or cleared by the [place] FDA  and has been authorized for detection and/or diagnosis of SARS-CoV-2 by FDA under an Emergency Use Authorization (EUA). This EUA will remain in effect (meaning this test can be used) for the duration of the COVID-19 declaration under Section 564(b)(1) of the Act, 21 U.S.C. section 360bbb-3(b)(1), unless the authorization is terminated or revoked.  Performed at [facility], [address] [place], [place] [address]   C Difficile Quick Screen w PCR reflex     Status: None   Collection Time: 02/14/23  9:30 PM   Specimen: Stool  Result Value Ref Range Status   C Diff antigen NEGATIVE NEGATIVE Final   C Diff toxin NEGATIVE NEGATIVE Final   C Diff interpretation No C. difficile detected.  Final    Comment: Performed at [facility], [address], [place], [place] [address]     Radiology Studies: No results found.   Pamella Pert, MD, PhD [facility]  Between 7 am - 7 pm I am available, please contact me via Amion (for emergencies) or Securechat (non urgent messages)  Between 7 pm - 7 am I am not available, please contact night coverage MD/APP via Amion

## 2023-02-17 DIAGNOSIS — N179 Acute kidney failure, unspecified: Secondary | ICD-10-CM | POA: Diagnosis not present

## 2023-02-17 LAB — COMPREHENSIVE METABOLIC PANEL
ALT: 32 U/L (ref 0–44)
AST: 12 U/L — ABNORMAL LOW (ref 15–41)
Albumin: 3.2 g/dL — ABNORMAL LOW (ref 3.5–5.0)
Alkaline Phosphatase: 47 U/L (ref 38–126)
Anion gap: 9 (ref 5–15)
BUN: 16 mg/dL (ref 6–20)
CO2: 19 mmol/L — ABNORMAL LOW (ref 22–32)
Calcium: 8.7 mg/dL — ABNORMAL LOW (ref 8.9–10.3)
Chloride: 110 mmol/L (ref 98–111)
Creatinine, Ser: 1.16 mg/dL (ref 0.61–1.24)
GFR, Estimated: >60 mL/min (ref >60)
Glucose, Bld: 99 mg/dL (ref 70–99)
Potassium: 3.5 mmol/L (ref 3.5–5.1)
Sodium: 138 mmol/L (ref 135–145)
Total Bilirubin: 0.4 mg/dL (ref <1.2)
Total Protein: 6.7 g/dL (ref 6.5–8.1)

## 2023-02-17 LAB — CBC
HCT: 43.3 % (ref 39.0–52.0)
Hemoglobin: 13.7 g/dL (ref 13.0–17.0)
MCH: 27.5 pg (ref 26.0–34.0)
MCHC: 31.6 g/dL (ref 30.0–36.0)
MCV: 86.9 fL (ref 80.0–100.0)
Platelets: 182 10*3/uL (ref 150–400)
RBC: 4.98 MIL/uL (ref 4.22–5.81)
RDW: 14.5 % (ref 11.5–15.5)
WBC: 6 10*3/uL (ref 4.0–10.5)
nRBC: 0 % (ref 0.0–0.2)

## 2023-02-17 MED ORDER — OXYCODONE HCL 5 MG PO TABS: 5.0000 mg | ORAL_TABLET | ORAL | Status: DC | PRN

## 2023-02-17 NOTE — Discharge Summary (Signed)
Physician Discharge Summary  ANVAY ARAKELIAN UEA:[id number] DOB: Aug 24, 1985 DOA: 02/14/2023  PCP: Kaleen Mask, MD  Admit date: 02/14/2023 Discharge date: 02/17/2023  Admitted From: home Disposition:  home  Recommendations for Outpatient Follow-up:  Follow up with PCP in 1-2 weeks  Home Health: none Equipment/Devices: none  Discharge Condition: stable CODE STATUS: Full code Diet Orders (From admission, onward)     Start     Ordered   02/17/23 0712  Diet regular Room service appropriate? Yes; Fluid consistency: Thin  Diet effective now       Question Answer Comment  Room service appropriate? Yes   Fluid consistency: Thin      02/17/23 0711            HPI: Per admitting MD, This is a 37 year old male with no significant past medical history.  Per patient today around 5 AM he developed severe nausea, vomiting, diarrhea.  He also endorses generalized abdominal pain.  He states his diarrhea was to the point he became lightheaded, dizzy and suffered a loss of consciousness.  He did not hit his head as his wife was able to break his fall.  Denies any blood in his stool.  He is going almost every 15 minutes.  Around noon he took a dose of Imodium.  His diarrhea persists, is liquid but has improved.  He denies any recent antibiotic use.  He denies any sick contacts.  He believes his food poisoning based on need he had for breakfast.  At home his took his temperatures Tmax was 103.  He came to the ER. In the ER patient with borderline BPs, tachycardia up to 134, Tmax 102.9. Creatinine 1.91 [baseline normal], WBCs 19.4, respiratory panel negative.  C. difficile toxins done and negative.  CT abdomen pelvis consistent with diarrhea.  Patient has been given a total of 4 L IV fluid bolus.  Admission requested.  Hospital Course / Discharge diagnoses: Principal Problem:   AKI (acute kidney injury) (HCC) Active Problems:   Gastroenteritis   Syncope and collapse   Gastroenteritis,  acute   Principal problem Acute intractable abdominal pain, nausea, vomiting, diarrhea - likely due to gastroenteritis as evidenced on the CT scan on admission.  Possibly food induced.  GI pathogen panel negative, C. difficile negative, RVP negative.  He was treated conservatively with supportive care, IV fluid, diet was slowly advanced and now eventually tolerating a regular diet.  His diarrhea is slowing down.  With improvement, he will be discharged home in stable condition   Active problems AKI-prerenal, creatinine has improved with fluids Syncope-possibly orthostatic /hypovolemic in nature.  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     Follow-up Information     Kaleen Mask, MD Follow up in 1 week(s).   Specialty: Family Medicine Contact information: [address] [place] [address] [phone]                 Consultations: none  Procedures/Studies:  CT ABDOMEN PELVIS W CONTRAST  Result Date: 02/14/2023 CLINICAL DATA:  Diarrhea and emesis EXAM: CT ABDOMEN AND PELVIS WITH CONTRAST TECHNIQUE: Multidetector CT  imaging of the abdomen and pelvis was performed using the standard protocol following bolus administration of intravenous contrast. RADIATION DOSE REDUCTION: This exam was performed according to the departmental dose-optimization program which includes automated exposure control, adjustment of the mA and/or kV according to patient size and/or use of iterative reconstruction technique. CONTRAST:  75mL OMNIPAQUE IOHEXOL 350 MG/ML SOLN COMPARISON:  CT report 04/17/2001 FINDINGS: Lower chest: Lung bases are clear Hepatobiliary: No focal liver abnormality  is seen. No gallstones, gallbladder wall thickening, or biliary dilatation. Pancreas: Unremarkable. No pancreatic ductal dilatation or surrounding inflammatory changes. Spleen: Normal in size without focal abnormality. Adrenals/Urinary Tract: Adrenal glands are unremarkable. Kidneys are normal, without renal calculi, focal lesion, or hydronephrosis. Bladder is unremarkable. Stomach/Bowel: Mild fluid distension of stomach. Small hiatal hernia diffuse fluid-filled small and large bowel with some proximal small bowel distension up to 3.3 cm but no well-defined transition point. Mild small bowel wall thickening in the right upper quadrant, for example series 2, image 38. Negative appendix. Vascular/Lymphatic: No significant vascular findings are present. No enlarged abdominal or pelvic lymph nodes. Reproductive: Prostate is unremarkable. Other: Negative for pelvic effusion or free air. Small fat containing left inguinal hernia Musculoskeletal: No acute or significant osseous findings. IMPRESSION: 1. Diffuse fluid-filled small and large bowel with some proximal small bowel distension but no well-defined transition point. Mild areas of small bowel wall thickening. Findings are suggestive of enteritis/diarrheal illness. No convincing obstruction 2. Small hiatal hernia. Electronically Signed   By: Jasmine Pang M.D.   On: 02/14/2023 21:20     Subjective: - no chest pain, shortness of breath, no abdominal pain, nausea or vomiting.   Discharge Exam: BP 123/89 (BP Location: Right Arm)   Pulse 80   Temp 98.1 F (36.7 C)   Resp 20   Ht 5\' 9"  (1.753 m)   Wt 113.4 kg   SpO2 98%   BMI 36.92 kg/m   General: Pt is alert, awake, not in acute distress Cardiovascular: RRR, S1/S2 +, no rubs, no gallops Respiratory: CTA bilaterally, no wheezing, no rhonchi Abdominal: Soft, NT, ND, bowel sounds + Extremities: no edema, no cyanosis    The results of significant diagnostics from this hospitalization (including  imaging, microbiology, ancillary and laboratory) are listed below for reference.     Microbiology: Recent Results (from the past 240 hour(s))  Gastrointestinal Panel by PCR , Stool     Status: None   Collection Time: 02/14/23  9:19 PM   Specimen: Stool  Result Value Ref Range Status   Campylobacter species NOT DETECTED NOT DETECTED Final   Plesimonas shigelloides NOT DETECTED NOT DETECTED Final   Salmonella species NOT DETECTED NOT DETECTED Final   Yersinia enterocolitica NOT DETECTED NOT DETECTED Final   Vibrio species NOT DETECTED NOT DETECTED Final   Vibrio cholerae NOT DETECTED NOT DETECTED Final   Enteroaggregative E coli (EAEC) NOT DETECTED NOT DETECTED Final   Enteropathogenic E coli (EPEC) NOT DETECTED NOT DETECTED Final   Enterotoxigenic E coli (ETEC) NOT DETECTED NOT DETECTED Final   Shiga like toxin producing E coli (STEC) NOT DETECTED NOT DETECTED Final   Shigella/Enteroinvasive E coli (EIEC) NOT DETECTED NOT DETECTED Final   Cryptosporidium NOT DETECTED NOT DETECTED Final   Cyclospora cayetanensis NOT DETECTED NOT DETECTED Final   Entamoeba histolytica NOT DETECTED NOT DETECTED Final   Giardia lamblia NOT DETECTED NOT DETECTED Final   Adenovirus F40/41 NOT DETECTED NOT DETECTED Final   Astrovirus NOT DETECTED NOT DETECTED Final   Norovirus GI/GII  NOT DETECTED NOT DETECTED Final   Rotavirus A NOT DETECTED NOT DETECTED Final   Sapovirus (I, II, IV, and V) NOT DETECTED NOT DETECTED Final    Comment: Performed at [facility], [address] [place], [place] [address]  Resp panel by RT-PCR (RSV, Flu A&B, Covid)     Status: None   Collection Time: 02/14/23  9:19 PM   Specimen: Nasal Swab  Result Value Ref Range Status   SARS Coronavirus 2 by RT PCR NEGATIVE NEGATIVE Final   Influenza A by PCR NEGATIVE NEGATIVE Final   Influenza B by PCR NEGATIVE NEGATIVE Final    Comment: (NOTE) The Xpert Xpress SARS-CoV-2/FLU/RSV plus assay is intended as an aid in the  diagnosis of influenza from Nasopharyngeal swab specimens and should not be used as a sole basis for treatment. Nasal washings and aspirates are unacceptable for Xpert Xpress SARS-CoV-2/FLU/RSV testing.  Fact Sheet for Patients: BloggerCourse.com  Fact Sheet for Healthcare Providers: SeriousBroker.it  This test is not yet approved or cleared by the [place] FDA and has been authorized for detection and/or diagnosis of SARS-CoV-2 by FDA under an Emergency Use Authorization (EUA). This EUA will remain in effect (meaning this test can be used) for the duration of the COVID-19 declaration under Section 564(b)(1) of the Act, 21 U.S.C. section 360bbb-3(b)(1), unless the authorization is terminated or revoked.     Resp Syncytial Virus by PCR NEGATIVE NEGATIVE Final    Comment: (NOTE) Fact Sheet for Patients: BloggerCourse.com  Fact Sheet for Healthcare Providers: SeriousBroker.it  This test is not yet approved or cleared by the [place] FDA and has been authorized for detection and/or diagnosis of SARS-CoV-2 by FDA under an Emergency Use Authorization (EUA). This EUA will remain in effect (meaning this test can be used) for the duration of the COVID-19 declaration under Section 564(b)(1) of the Act, 21 U.S.C. section 360bbb-3(b)(1), unless the authorization is terminated or revoked.  Performed at [facility], [address], [place], [place] [address]   C Difficile Quick Screen w PCR reflex     Status: None   Collection Time: 02/14/23  9:30 PM   Specimen: Stool  Result Value Ref Range Status   C Diff antigen NEGATIVE NEGATIVE Final   C Diff toxin NEGATIVE NEGATIVE Final   C Diff interpretation No C. difficile detected.  Final    Comment: Performed at [facility], [address], [place], [place] [address]     Labs: Basic Metabolic Panel: Recent Labs   Lab 02/14/23 1409 02/15/23 0350 02/16/23 0349 02/17/23 0339  NA 136 137 135 138  K 4.2 3.6 3.5 3.5  CL 106 111 104 110  CO2 19* 18* 23 19*  GLUCOSE 144* 124* 115* 99  BUN 21* 19 18 16   CREATININE 1.91* 1.31* 1.25* 1.16  CALCIUM 9.8 7.8* 8.1* 8.7*  MG  --  1.2*  --   --    Liver Function Tests: Recent Labs  Lab 02/16/23 0349 02/17/23 0339  AST 30 12*  ALT 52* 32  ALKPHOS 54 47  BILITOT 0.6 0.4  PROT 6.4* 6.7  ALBUMIN 3.4* 3.2*   CBC: Recent Labs  Lab 02/14/23 1409 02/15/23 0350 02/16/23 0349 02/17/23 0339  WBC 19.4* 12.8* 6.5 6.0  NEUTROABS  --  10.7*  --   --   HGB 16.9 14.1 14.5 13.7  HCT 53.1* 44.3 45.3 43.3  MCV 88.1 86.4 85.5 86.9  PLT 253 166 197 182  CBG: Recent Labs  Lab 02/14/23 1414 02/15/23 0802  GLUCAP 166* 149*   Hgb A1c No results for input(s): "HGBA1C" in the last 72 hours. Lipid Profile No results for input(s): "CHOL", "HDL", "LDLCALC", "TRIG", "CHOLHDL", "LDLDIRECT" in the last 72 hours. Thyroid function studies No results for input(s): "TSH", "T4TOTAL", "T3FREE", "THYROIDAB" in the last 72 hours.  Invalid input(s): "FREET3" Urinalysis    Component Value Date/Time   COLORURINE AMBER (A) 02/14/2023 1816   APPEARANCEUR HAZY (A) 02/14/2023 1816   LABSPEC >1.046 (H) 02/14/2023 1816   PHURINE 5.0 02/14/2023 1816   GLUCOSEU NEGATIVE 02/14/2023 1816   HGBUR SMALL (A) 02/14/2023 1816   BILIRUBINUR NEGATIVE 02/14/2023 1816   KETONESUR NEGATIVE 02/14/2023 1816   PROTEINUR 100 (A) 02/14/2023 1816   NITRITE NEGATIVE 02/14/2023 1816   LEUKOCYTESUR NEGATIVE 02/14/2023 1816    FURTHER DISCHARGE INSTRUCTIONS:   Get Medicines reviewed and adjusted: Please take all your medications with you for your next visit with your Primary MD   Laboratory/radiological data: Please request your Primary MD to go over all hospital tests and procedure/radiological results at the follow up, please ask your Primary MD to get all Hospital records sent to  his/her office.   In some cases, they will be blood work, cultures and biopsy results pending at the time of your discharge. Please request that your primary care M.D. goes through all the records of your hospital data and follows up on these results.   Also Note the following: If you experience worsening of your admission symptoms, develop shortness of breath, life threatening emergency, suicidal or homicidal thoughts you must seek medical attention immediately by calling 911 or calling your MD immediately  if symptoms less severe.   You must read complete instructions/literature along with all the possible adverse reactions/side effects for all the Medicines you take and that have been prescribed to you. Take any new Medicines after you have completely understood and accpet all the possible adverse reactions/side effects.    Do not drive when taking Pain medications or sleeping medications (Benzodaizepines)   Do not take more than prescribed Pain, Sleep and Anxiety Medications. It is not advisable to combine anxiety,sleep and pain medications without talking with your primary care practitioner   Special Instructions: If you have smoked or chewed Tobacco  in the last 2 yrs please stop smoking, stop any regular Alcohol  and or any Recreational drug use.   Wear Seat belts while driving.   Please note: You were cared for by a hospitalist during your hospital stay. Once you are discharged, your primary care physician will handle any further medical issues. Please note that NO REFILLS for any discharge medications will be authorized once you are discharged, as it is imperative that you return to your primary care physician (or establish a relationship with a primary care physician if you do not have one) for your post hospital discharge needs so that they can reassess your need for medications and monitor your lab values.  Time coordinating discharge: 35 minutes  SIGNED:  Pamella Pert, MD,  PhD 02/17/2023, 3:29 PM

## 2023-02-17 NOTE — TOC Progression Note (Signed)
Transition of Care [facility]) - Progression Note    Patient Details  Name: Russell Stephens MRN: [id number] Date of Birth: 06-Jun-1985  Transition of Care [facility]) CM/SW Contact  Tom-Johnson, Hershal Coria, RN Phone Number: 02/17/2023, 1:10 PM  Clinical Narrative:     Patient continues to c/o intermittent Diarrhea with N/V. Patient restarted on IV fluids.   Patient not Medically ready for discharge.  CM will continue to follow as patient progresses with care towards discharge.        Expected Discharge Plan and Services                                         Social Determinants of Health (SDOH) Interventions SDOH Screenings   Food Insecurity: No Food Insecurity (02/15/2023)  Housing: Low Risk  (02/15/2023)  Transportation Needs: No Transportation Needs (02/15/2023)  Utilities: Not At Risk (02/15/2023)  Tobacco Use: Low Risk  (02/15/2023)    Readmission Risk Interventions     No data to display

## 2023-02-17 NOTE — Progress Notes (Signed)
DISCHARGE NOTE HOME Russell Stephens to be discharged Home per MD order. Discussed prescriptions - no new scripts  and follow up appointments with the patient. Patient verbalized understanding.  Skin clean, dry and intact without evidence of skin break down, no evidence of skin tears noted. IV catheter discontinued intact. Site without signs and symptoms of complications. Dressing and pressure applied. Pt denies pain at the site currently. No complaints noted.  Patient free of lines, drains, and wounds.   An After Visit Summary (AVS) was printed and given to the patient. Patient escorted via wheelchair, and discharged home via private auto.  Irwin Brakeman, RN

## 2023-02-17 NOTE — TOC Transition Note (Signed)
Transition of Care [facility]) - CM/SW Discharge Note   Patient Details  Name: Russell Stephens MRN: [id number] Date of Birth: 1985-06-14  Transition of Care [facility]) CM/SW Contact:  Tom-Johnson, Hershal Coria, RN Phone Number: 02/17/2023, 3:53 PM   Clinical Narrative:     Patient is scheduled for discharge today.  Readmission Risk Assessment done. Hospital f/u and discharge instructions on AVS. No TOC needs or recommendations noted. Wife, Aggie Cosier to transport at discharge.  No further TOC needs noted.          Final next level of care: Home/Self Care Barriers to Discharge: Barriers Resolved   Patient Goals and CMS Choice CMS Medicare.gov Compare Post Acute Care list provided to:: Patient Choice offered to / list presented to : NA  Discharge Placement                  Patient to be transferred to facility by: Wife Name of family member notified: [facility]    Discharge Plan and Services Additional resources added to the After Visit Summary for                  DME Arranged: N/A DME Agency: NA       HH Arranged: NA HH Agency: NA        Social Determinants of Health (SDOH) Interventions SDOH Screenings   Food Insecurity: No Food Insecurity (02/15/2023)  Housing: Low Risk  (02/15/2023)  Transportation Needs: No Transportation Needs (02/15/2023)  Utilities: Not At Risk (02/15/2023)  Tobacco Use: Low Risk  (02/15/2023)     Readmission Risk Interventions    02/17/2023    3:52 PM  Readmission Risk Prevention Plan  Post Dischage Appt Complete  Medication Screening Complete  Transportation Screening Complete

## 2023-02-17 NOTE — Discharge Instructions (Signed)
Follow with Kaleen Mask, MD in 5-7 days  Please get a complete blood count and chemistry panel checked by your Primary MD at your next visit, and again as instructed by your Primary MD. Please get your medications reviewed and adjusted by your Primary MD.  Please request your Primary MD to go over all Hospital Tests and Procedure/Radiological results at the follow up, please get all Hospital records sent to your Prim MD by signing hospital release before you go home.  In some cases, there will be blood work, cultures and biopsy results pending at the time of your discharge. Please request that your primary care M.D. goes through all the records of your hospital data and follows up on these results.  If you had Pneumonia of Lung problems at the Hospital: Please get a 2 view Chest X ray done in 6-8 weeks after hospital discharge or sooner if instructed by your Primary MD.  If you have Congestive Heart Failure: Please call your Cardiologist or Primary MD anytime you have any of the following symptoms:  1) 3 pound weight gain in 24 hours or 5 pounds in 1 week  2) shortness of breath, with or without a dry hacking cough  3) swelling in the hands, feet or stomach  4) if you have to sleep on extra pillows at night in order to breathe  Follow cardiac low salt diet and 1.5 lit/day fluid restriction.  If you have diabetes Accuchecks 4 times/day, Once in AM empty stomach and then before each meal. Log in all results and show them to your primary doctor at your next visit. If any glucose reading is under 80 or above 300 call your primary MD immediately.  If you have Seizure/Convulsions/Epilepsy: Please do not drive, operate heavy machinery, participate in activities at heights or participate in high speed sports until you have seen by Primary MD or a Neurologist and advised to do so again. Per [facility] statutes, patients with seizures are not allowed to drive until they have been  seizure-free for six months.  Use caution when using heavy equipment or power tools. Avoid working on ladders or at heights. Take showers instead of baths. Ensure the water temperature is not too high on the home water heater. Do not go swimming alone. Do not lock yourself in a room alone (i.e. bathroom). When caring for infants or small children, sit down when holding, feeding, or changing them to minimize risk of injury to the child in the event you have a seizure. Maintain good sleep hygiene. Avoid alcohol.   If you had Gastrointestinal Bleeding: Please ask your Primary MD to check a complete blood count within one week of discharge or at your next visit. Your endoscopic/colonoscopic biopsies that are pending at the time of discharge, will also need to followed by your Primary MD.  Get Medicines reviewed and adjusted. Please take all your medications with you for your next visit with your Primary MD  Please request your Primary MD to go over all hospital tests and procedure/radiological results at the follow up, please ask your Primary MD to get all Hospital records sent to his/her office.  If you experience worsening of your admission symptoms, develop shortness of breath, life threatening emergency, suicidal or homicidal thoughts you must seek medical attention immediately by calling 911 or calling your MD immediately  if symptoms less severe.  You must read complete instructions/literature along with all the possible adverse reactions/side effects for all the Medicines you  take and that have been prescribed to you. Take any new Medicines after you have completely understood and accpet all the possible adverse reactions/side effects.   Do not drive or operate heavy machinery when taking Pain medications.   Do not take more than prescribed Pain, Sleep and Anxiety Medications  Special Instructions: If you have smoked or chewed Tobacco  in the last 2 yrs please stop smoking, stop any regular  Alcohol  and or any Recreational drug use.  Wear Seat belts while driving.  Please note You were cared for by a hospitalist during your hospital stay. If you have any questions about your discharge medications or the care you received while you were in the hospital after you are discharged, you can call the unit and asked to speak with the hospitalist on call if the hospitalist that took care of you is not available. Once you are discharged, your primary care physician will handle any further medical issues. Please note that NO REFILLS for any discharge medications will be authorized once you are discharged, as it is imperative that you return to your primary care physician (or establish a relationship with a primary care physician if you do not have one) for your aftercare needs so that they can reassess your need for medications and monitor your lab values.  You can reach the hospitalist office at phone [phone] or fax [phone]   If you do not have a primary care physician, you can call [phone] for a physician referral.  Activity: As tolerated with Full fall precautions use walker/cane & assistance as needed    Diet: regular  Disposition Home

## 2023-02-18 ENCOUNTER — Inpatient Hospital Stay (HOSPITAL_COMMUNITY)
Admission: EM | Admit: 2023-02-18 | Discharge: 2023-02-24 | DRG: 690 | Disposition: A | Discharge status: Home / Self Care | Payer: No Typology Code available for payment source | Attending: Internal Medicine | Admitting: Internal Medicine

## 2023-02-18 ENCOUNTER — Emergency Department (HOSPITAL_COMMUNITY): Payer: No Typology Code available for payment source

## 2023-02-18 ENCOUNTER — Other Ambulatory Visit: Payer: Self-pay

## 2023-02-18 ENCOUNTER — Encounter (HOSPITAL_COMMUNITY): Payer: Self-pay | Admitting: Emergency Medicine

## 2023-02-18 DIAGNOSIS — E860 Dehydration: Secondary | ICD-10-CM | POA: Diagnosis present

## 2023-02-18 DIAGNOSIS — K56609 Unspecified intestinal obstruction, unspecified as to partial versus complete obstruction: Principal | ICD-10-CM | POA: Diagnosis present

## 2023-02-18 DIAGNOSIS — R748 Abnormal levels of other serum enzymes: Secondary | ICD-10-CM

## 2023-02-18 DIAGNOSIS — E669 Obesity, unspecified: Secondary | ICD-10-CM | POA: Diagnosis present

## 2023-02-18 DIAGNOSIS — G8929 Other chronic pain: Secondary | ICD-10-CM | POA: Diagnosis present

## 2023-02-18 DIAGNOSIS — N136 Pyonephrosis: Secondary | ICD-10-CM | POA: Diagnosis not present

## 2023-02-18 DIAGNOSIS — N179 Acute kidney failure, unspecified: Secondary | ICD-10-CM | POA: Diagnosis present

## 2023-02-18 DIAGNOSIS — R109 Unspecified abdominal pain: Secondary | ICD-10-CM | POA: Diagnosis not present

## 2023-02-18 DIAGNOSIS — Z8249 Family history of ischemic heart disease and other diseases of the circulatory system: Secondary | ICD-10-CM

## 2023-02-18 DIAGNOSIS — E876 Hypokalemia: Secondary | ICD-10-CM | POA: Diagnosis present

## 2023-02-18 DIAGNOSIS — D72829 Elevated white blood cell count, unspecified: Secondary | ICD-10-CM

## 2023-02-18 DIAGNOSIS — N1831 Chronic kidney disease, stage 3a: Secondary | ICD-10-CM | POA: Diagnosis present

## 2023-02-18 DIAGNOSIS — K567 Ileus, unspecified: Secondary | ICD-10-CM | POA: Diagnosis present

## 2023-02-18 DIAGNOSIS — E872 Acidosis, unspecified: Secondary | ICD-10-CM | POA: Diagnosis present

## 2023-02-18 DIAGNOSIS — Z6838 Body mass index (BMI) 38.0-38.9, adult: Secondary | ICD-10-CM

## 2023-02-18 LAB — COMPREHENSIVE METABOLIC PANEL
ALT: 34 U/L (ref 0–44)
AST: 18 U/L (ref 15–41)
Albumin: 3.6 g/dL (ref 3.5–5.0)
Alkaline Phosphatase: 62 U/L (ref 38–126)
Anion gap: 11 (ref 5–15)
BUN: 13 mg/dL (ref 6–20)
CO2: 20 mmol/L — ABNORMAL LOW (ref 22–32)
Calcium: 8.9 mg/dL (ref 8.9–10.3)
Chloride: 107 mmol/L (ref 98–111)
Creatinine, Ser: 1.23 mg/dL (ref 0.61–1.24)
GFR, Estimated: >60 mL/min (ref >60)
Glucose, Bld: 96 mg/dL (ref 70–99)
Potassium: 3.2 mmol/L — ABNORMAL LOW (ref 3.5–5.1)
Sodium: 138 mmol/L (ref 135–145)
Total Bilirubin: 1 mg/dL (ref <1.2)
Total Protein: 7 g/dL (ref 6.5–8.1)

## 2023-02-18 LAB — URINALYSIS, ROUTINE W REFLEX MICROSCOPIC
Bacteria, UA: NONE SEEN
Glucose, UA: NEGATIVE mg/dL
Ketones, ur: NEGATIVE mg/dL
Leukocytes,Ua: NEGATIVE
Nitrite: NEGATIVE
Protein, ur: 100 mg/dL — AB
Specific Gravity, Urine: 1.031 — ABNORMAL HIGH (ref 1.005–1.030)
pH: 5 (ref 5.0–8.0)

## 2023-02-18 LAB — CBC WITH DIFFERENTIAL/PLATELET
Abs Immature Granulocytes: 0.11 10*3/uL — ABNORMAL HIGH (ref 0.00–0.07)
Basophils Absolute: 0.1 10*3/uL (ref 0.0–0.1)
Basophils Relative: 1 %
Eosinophils Absolute: 0.2 10*3/uL (ref 0.0–0.5)
Eosinophils Relative: 1 %
HCT: 46.7 % (ref 39.0–52.0)
Hemoglobin: 15.2 g/dL (ref 13.0–17.0)
Immature Granulocytes: 1 %
Lymphocytes Relative: 12 %
Lymphs Abs: 1.7 10*3/uL (ref 0.7–4.0)
MCH: 27.9 pg (ref 26.0–34.0)
MCHC: 32.5 g/dL (ref 30.0–36.0)
MCV: 85.7 fL (ref 80.0–100.0)
Monocytes Absolute: 1.6 10*3/uL — ABNORMAL HIGH (ref 0.1–1.0)
Monocytes Relative: 12 %
Neutro Abs: 10.2 10*3/uL — ABNORMAL HIGH (ref 1.7–7.7)
Neutrophils Relative %: 73 %
Platelets: 266 10*3/uL (ref 150–400)
RBC: 5.45 MIL/uL (ref 4.22–5.81)
RDW: 14 % (ref 11.5–15.5)
WBC: 13.9 10*3/uL — ABNORMAL HIGH (ref 4.0–10.5)
nRBC: 0 % (ref 0.0–0.2)

## 2023-02-18 LAB — LIPASE, BLOOD: Lipase: 137 U/L — ABNORMAL HIGH (ref 11–51)

## 2023-02-18 MED ORDER — HYDROMORPHONE HCL 1 MG/ML IJ SOLN
1.0000 mg | Freq: Once | INTRAMUSCULAR | Status: AC
  Administered 2023-02-18: 1 mg via INTRAVENOUS
  Filled 2023-02-18: qty 1

## 2023-02-18 MED ORDER — IOHEXOL 350 MG/ML SOLN
75.0000 mL | Freq: Once | INTRAVENOUS | Status: AC | PRN
  Administered 2023-02-18: 75 mL via INTRAVENOUS

## 2023-02-18 MED ORDER — PANTOPRAZOLE SODIUM 40 MG IV SOLR
40.0000 mg | Freq: Once | INTRAVENOUS | Status: AC
  Administered 2023-02-18: 40 mg via INTRAVENOUS
  Filled 2023-02-18: qty 10

## 2023-02-18 MED ORDER — SODIUM CHLORIDE 0.9 % IV BOLUS
1000.0000 mL | Freq: Once | INTRAVENOUS | Status: AC
  Administered 2023-02-18: 1000 mL via INTRAVENOUS

## 2023-02-18 MED ORDER — ONDANSETRON HCL 4 MG/2ML IJ SOLN
4.0000 mg | Freq: Once | INTRAMUSCULAR | Status: AC
  Administered 2023-02-18: 4 mg via INTRAVENOUS
  Filled 2023-02-18: qty 2

## 2023-02-18 NOTE — ED Notes (Signed)
ED TO INPATIENT HANDOFF REPORT  ED Nurse Name and Phone #:   Molli Hazard [phone]  S Name/Age/Gender Russell Stephens 37 y.o. male Room/Bed: H015C/H015C  Code Status   Code Status: Prior  Home/SNF/Other Home Patient oriented to: self, place, time, and situation Is this baseline? Yes   Triage Complete: Triage complete  Chief Complaint SBO (small bowel obstruction) (HCC) [K56.609]  Triage Note Pt discharged from inpatient earlier in the week d/t food poisoning/dehydration.  Pt has been advancing diet slowly at home but has severe diffuse abdominal pain and distention that is uncontrolled with OTC medications.  No n/v/d or shob.    Allergies Allergies  Allergen Reactions   Dairy Aid [Tilactase] Other (See Comments)    Upset stomach; "tears me up"    Level of Care/Admitting Diagnosis ED Disposition     ED Disposition  Admit   Condition  --   Comment  Hospital Area: [facility] [[id number]]  Level of Care: Telemetry Medical [104]  May place patient in observation at [facility] or [facility] if equivalent level of care is available:: Yes  Covid Evaluation: Asymptomatic - no recent exposure (last 10 days) testing not required  Diagnosis: SBO (small bowel obstruction) [facility]) [[id number]]  Admitting Physician: John Giovanni [[id number]]  Attending Physician: John Giovanni [[id number]]          B Medical/Surgery History History reviewed. No pertinent past medical history. Past Surgical History:  Procedure Laterality Date   HERNIA REPAIR       A IV Location/Drains/Wounds Patient Lines/Drains/Airways Status     Active Line/Drains/Airways     Name Placement date Placement time Site Days   Peripheral IV 02/18/23 20 G Anterior;Left;Proximal Forearm 02/18/23  2037  Forearm  less than 1            Intake/Output Last 24 hours No intake or output data in the 24 hours ending 02/18/23 2358  Labs/Imaging Results for orders placed or performed during  the hospital encounter of 02/18/23 (from the past 48 hour(s))  Urinalysis, Routine w reflex microscopic -Urine, Clean Catch     Status: Abnormal   Collection Time: 02/18/23  3:32 PM  Result Value Ref Range   Color, Urine AMBER (A) YELLOW    Comment: BIOCHEMICALS MAY BE AFFECTED BY COLOR   APPearance TURBID (A) CLEAR   Specific Gravity, Urine 1.031 (H) 1.005 - 1.030   pH 5.0 5.0 - 8.0   Glucose, UA NEGATIVE NEGATIVE mg/dL   Hgb urine dipstick SMALL (A) NEGATIVE   Bilirubin Urine SMALL (A) NEGATIVE   Ketones, ur NEGATIVE NEGATIVE mg/dL   Protein, ur 413 (A) NEGATIVE mg/dL   Nitrite NEGATIVE NEGATIVE   Leukocytes,Ua NEGATIVE NEGATIVE   RBC / HPF 0-5 0 - 5 RBC/hpf   WBC, UA 0-5 0 - 5 WBC/hpf   Bacteria, UA NONE SEEN NONE SEEN   Squamous Epithelial / HPF 0-5 0 - 5 /HPF   Mucus PRESENT    Amorphous Crystal PRESENT    Non Squamous Epithelial 0-5 (A) NONE SEEN    Comment: Performed at [facility], [address], [place], [place] [address]  CBC with Differential     Status: Abnormal   Collection Time: 02/18/23  3:37 PM  Result Value Ref Range   WBC 13.9 (H) 4.0 - 10.5 K/uL   RBC 5.45 4.22 - 5.81 MIL/uL   Hemoglobin 15.2 13.0 - 17.0 g/dL   HCT 02.7 25.3 - 66.4 %   MCV 85.7 80.0 -  100.0 fL   MCH 27.9 26.0 - 34.0 pg   MCHC 32.5 30.0 - 36.0 g/dL   RDW 46.9 62.9 - 52.8 %   Platelets 266 150 - 400 K/uL   nRBC 0.0 0.0 - 0.2 %   Neutrophils Relative % 73 %   Neutro Abs 10.2 (H) 1.7 - 7.7 K/uL   Lymphocytes Relative 12 %   Lymphs Abs 1.7 0.7 - 4.0 K/uL   Monocytes Relative 12 %   Monocytes Absolute 1.6 (H) 0.1 - 1.0 K/uL   Eosinophils Relative 1 %   Eosinophils Absolute 0.2 0.0 - 0.5 K/uL   Basophils Relative 1 %   Basophils Absolute 0.1 0.0 - 0.1 K/uL   Immature Granulocytes 1 %   Abs Immature Granulocytes 0.11 (H) 0.00 - 0.07 K/uL    Comment: Performed at [facility], [address] [place], [place] [address]  Comprehensive metabolic panel     Status: Abnormal    Collection Time: 02/18/23  3:37 PM  Result Value Ref Range   Sodium 138 135 - 145 mmol/L   Potassium 3.2 (L) 3.5 - 5.1 mmol/L   Chloride 107 98 - 111 mmol/L   CO2 20 (L) 22 - 32 mmol/L   Glucose, Bld 96 70 - 99 mg/dL    Comment: Glucose reference range applies only to samples taken after fasting for at least 8 hours.   BUN 13 6 - 20 mg/dL   Creatinine, Ser 4.01 0.61 - 1.24 mg/dL   Calcium 8.9 8.9 - 02.7 mg/dL   Total Protein 7.0 6.5 - 8.1 g/dL   Albumin 3.6 3.5 - 5.0 g/dL   AST 18 15 - 41 U/L   ALT 34 0 - 44 U/L   Alkaline Phosphatase 62 38 - 126 U/L   Total Bilirubin 1.0 <1.2 mg/dL   GFR, Estimated >25 >36 mL/min    Comment: (NOTE) Calculated using the CKD-EPI Creatinine Equation (2021)    Anion gap 11 5 - 15    Comment: Performed at [facility], [address], [place], [place] [address]  Lipase, blood     Status: Abnormal   Collection Time: 02/18/23  3:37 PM  Result Value Ref Range   Lipase 137 (H) 11 - 51 U/L    Comment: Performed at [facility], [address] [place], [place] [address]   CT ABDOMEN PELVIS W CONTRAST  Result Date: 02/18/2023 CLINICAL DATA:  Abdominal pain, acute, nonlocalized EXAM: CT ABDOMEN AND PELVIS WITH CONTRAST TECHNIQUE: Multidetector CT imaging of the abdomen and pelvis was performed using the standard protocol following bolus administration of intravenous contrast. RADIATION DOSE REDUCTION: This exam was performed according to the departmental dose-optimization program which includes automated exposure control, adjustment of the mA and/or kV according to patient size and/or use of iterative reconstruction technique. CONTRAST:  75mL OMNIPAQUE IOHEXOL 350 MG/ML SOLN COMPARISON:  02/14/2023 FINDINGS: Lower chest: No acute abnormality. Small fluid-filled hiatal hernia. Hepatobiliary: No focal hepatic abnormality. Gallbladder unremarkable. Pancreas: No focal abnormality or ductal dilatation. Spleen: No focal abnormality.  Normal size.  Adrenals/Urinary Tract: No adrenal abnormality. No focal renal abnormality. No stones or hydronephrosis. Urinary bladder is unremarkable. Stomach/Bowel: Normal appendix. There are dilated fluid-filled small bowel loops into the pelvis measuring up 2 4 cm in diameter. Distal small bowel is decompressed. This is concerning for distal small bowel obstruction. Large bowel is fluid-filled but predominantly decompressed. Stomach decompressed. Vascular/Lymphatic: No evidence of aneurysm or adenopathy. Reproductive: No visible focal  abnormality. Other: No free fluid or free air. Musculoskeletal: No acute bony abnormality. IMPRESSION: Dilated small bowel into the pelvis with gradual caliber change to decompressed distal small bowel. While there is no well-defined focal change in bowel caliber, appearance is concerning for distal small-bowel obstruction. Electronically Signed   By: Charlett Nose M.D.   On: 02/18/2023 21:22    Pending Labs Unresulted Labs (From admission, onward)    None       Vitals/Pain Today's Vitals   02/18/23 2014 02/18/23 2036 02/18/23 2039 02/18/23 2215  BP:  (!) 139/99    Pulse:  100    Resp:  18    Temp:  98.2 F (36.8 C)    TempSrc:  Oral    SpO2: 99% 100%    PainSc:  8  8  2      Isolation Precautions No active isolations  Medications Medications  iohexol (OMNIPAQUE) 350 MG/ML injection 75 mL (75 mLs Intravenous Contrast Given 02/18/23 2110)  sodium chloride 0.9 % bolus 1,000 mL (1,000 mLs Intravenous Bolus 02/18/23 2148)  ondansetron (ZOFRAN) injection 4 mg (4 mg Intravenous Given 02/18/23 2148)  HYDROmorphone (DILAUDID) injection 1 mg (1 mg Intravenous Given 02/18/23 2147)  pantoprazole (PROTONIX) injection 40 mg (40 mg Intravenous Given 02/18/23 2147)    Mobility walks     Focused Assessments Cardiac Assessment Handoff:    No results found for: "CKTOTAL", "CKMB", "CKMBINDEX", "TROPONINI" No results found for: "DDIMER" Does the Patient currently have chest  pain? No   , Neuro Assessment Handoff:  Swallow screen pass? Yes          Neuro Assessment:   Neuro Checks:      Has TPA been given?  If patient is a Neuro Trauma and patient is going to OR before floor call report to 4N Charge nurse: [phone] or [phone]  , Renal Assessment Handoff:  Hemodialysis Schedule:  Last Hemodialysis date and time:    Restricted appendage:   , Pulmonary Assessment Handoff:  Lung sounds:   O2 Device: Room Air      R Recommendations: See Admitting Provider Note  Report given to:   Additional Notes:    Very pleasant and cooperative. Pt has obvious distension/bloating to abdomen with intermittent pain, has received meds and fluids, VS have been stable.

## 2023-02-18 NOTE — ED Provider Triage Note (Signed)
Emergency Medicine Provider Triage Evaluation Note  Russell Stephens , a 37 y.o. male  was evaluated in triage.  Pt complains of abdominal pain that has been ongoing since his discharge from the hospital for gastroenteritis/dehydration/AKI yesterday. Denies any nausea or vomiting. Reports non-bloody diarrhea. Denies fever or chills.   Review of Systems  Positive: As above Negative: As above  Physical Exam  BP (!) 143/90 (BP Location: Right Arm)   Pulse 96   Temp 98.4 F (36.9 C) (Oral)   Resp 18   SpO2 99%  Gen:   Awake, no distress   Resp:  Normal effort  MSK:   Moves extremities without difficulty  Other:  Abdomen distended and TTP diffusely  Medical Decision Making  Medically screening exam initiated at 3:36 PM.  Appropriate orders placed.  Russell Stephens was informed that the remainder of the evaluation will be completed by another provider, this initial triage assessment does not replace that evaluation, and the importance of remaining in the ED until their evaluation is complete.     Arabella Merles, PA-C 02/18/23 1537

## 2023-02-18 NOTE — ED Notes (Signed)
Pt left for CT.

## 2023-02-18 NOTE — ED Provider Notes (Signed)
[place] [facility]GENCY DEPARTMENT AT [facility] Provider Note   CSN: [id number] Arrival date & time: 02/18/23  1506     History  Chief Complaint  Patient presents with   Abdominal Pain    Russell Stephens is a 37 y.o. male.  Patient is a 37 year old male with a history of recent abdominal pain, nausea vomiting and AKI who was released from the hospital yesterday after being admitted for 4 days who reports that since going home his abdominal pain is worsened and he started vomiting again.  He reports his abdominal pain never went away but since being home has now gotten significantly worse.  The vomiting had resolved but has now returned.  He reports ongoing diarrhea.  He does report a surgery on his abdomen when he was 3 but he denies any issues with ongoing abdominal problems.  He has not had a fever denies excessive alcohol use and does not think he has had bad food exposure.  The history is provided by the patient, the spouse and medical records.  Abdominal Pain      Home Medications Prior to Admission medications   Not on File      Allergies    Dairy aid [tilactase]    Review of Systems   Review of Systems  Gastrointestinal:  Positive for abdominal pain.    Physical Exam Updated Vital Signs BP (!) 139/99 (BP Location: Left Arm)   Pulse 100   Temp 98.2 F (36.8 C) (Oral)   Resp 18   SpO2 100%  Physical Exam Vitals and nursing note reviewed.  Constitutional:      General: He is not in acute distress.    Appearance: He is well-developed.  HENT:     Head: Normocephalic and atraumatic.  Eyes:     Conjunctiva/sclera: Conjunctivae normal.     Pupils: Pupils are equal, round, and reactive to light.  Cardiovascular:     Rate and Rhythm: Regular rhythm. Tachycardia present.     Heart sounds: No murmur heard. Pulmonary:     Effort: Pulmonary effort is normal. No respiratory distress.     Breath sounds: Normal breath sounds. No wheezing or rales.   Abdominal:     General: There is no distension.     Palpations: Abdomen is soft.     Tenderness: There is abdominal tenderness in the right upper quadrant, epigastric area and periumbilical area. There is no guarding or rebound.  Musculoskeletal:        General: No tenderness. Normal range of motion.     Cervical back: Normal range of motion and neck supple.  Skin:    General: Skin is warm and dry.     Findings: No erythema or rash.  Neurological:     Mental Status: He is alert and oriented to person, place, and time.  Psychiatric:        Behavior: Behavior normal.     ED Results / Procedures / Treatments   Labs (all labs ordered are listed, but only abnormal results are displayed) Labs Reviewed  CBC WITH DIFFERENTIAL/PLATELET - Abnormal; Notable for the following components:      Result Value   WBC 13.9 (*)    Neutro Abs 10.2 (*)    Monocytes Absolute 1.6 (*)    Abs Immature Granulocytes 0.11 (*)    All other components within normal limits  COMPREHENSIVE METABOLIC PANEL - Abnormal; Notable for the following components:   Potassium 3.2 (*)    CO2 20 (*)  All other components within normal limits  LIPASE, BLOOD - Abnormal; Notable for the following components:   Lipase 137 (*)    All other components within normal limits  URINALYSIS, ROUTINE W REFLEX MICROSCOPIC - Abnormal; Notable for the following components:   Color, Urine AMBER (*)    APPearance TURBID (*)    Specific Gravity, Urine 1.031 (*)    Hgb urine dipstick SMALL (*)    Bilirubin Urine SMALL (*)    Protein, ur 100 (*)    Non Squamous Epithelial 0-5 (*)    All other components within normal limits    EKG None  Radiology CT ABDOMEN PELVIS W CONTRAST  Result Date: 02/18/2023 CLINICAL DATA:  Abdominal pain, acute, nonlocalized EXAM: CT ABDOMEN AND PELVIS WITH CONTRAST TECHNIQUE: Multidetector CT imaging of the abdomen and pelvis was performed using the standard protocol following bolus administration of  intravenous contrast. RADIATION DOSE REDUCTION: This exam was performed according to the departmental dose-optimization program which includes automated exposure control, adjustment of the mA and/or kV according to patient size and/or use of iterative reconstruction technique. CONTRAST:  75mL OMNIPAQUE IOHEXOL 350 MG/ML SOLN COMPARISON:  02/14/2023 FINDINGS: Lower chest: No acute abnormality. Small fluid-filled hiatal hernia. Hepatobiliary: No focal hepatic abnormality. Gallbladder unremarkable. Pancreas: No focal abnormality or ductal dilatation. Spleen: No focal abnormality.  Normal size. Adrenals/Urinary Tract: No adrenal abnormality. No focal renal abnormality. No stones or hydronephrosis. Urinary bladder is unremarkable. Stomach/Bowel: Normal appendix. There are dilated fluid-filled small bowel loops into the pelvis measuring up 2 4 cm in diameter. Distal small bowel is decompressed. This is concerning for distal small bowel obstruction. Large bowel is fluid-filled but predominantly decompressed. Stomach decompressed. Vascular/Lymphatic: No evidence of aneurysm or adenopathy. Reproductive: No visible focal abnormality. Other: No free fluid or free air. Musculoskeletal: No acute bony abnormality. IMPRESSION: Dilated small bowel into the pelvis with gradual caliber change to decompressed distal small bowel. While there is no well-defined focal change in bowel caliber, appearance is concerning for distal small-bowel obstruction. Electronically Signed   By: Charlett Nose M.D.   On: 02/18/2023 21:22    Procedures Procedures    Medications Ordered in ED Medications  iohexol (OMNIPAQUE) 350 MG/ML injection 75 mL (75 mLs Intravenous Contrast Given 02/18/23 2110)  sodium chloride 0.9 % bolus 1,000 mL (1,000 mLs Intravenous Bolus 02/18/23 2148)  ondansetron (ZOFRAN) injection 4 mg (4 mg Intravenous Given 02/18/23 2148)  HYDROmorphone (DILAUDID) injection 1 mg (1 mg Intravenous Given 02/18/23 2147)  pantoprazole  (PROTONIX) injection 40 mg (40 mg Intravenous Given 02/18/23 2147)    ED Course/ Medical Decision Making/ A&P                                 Medical Decision Making Amount and/or Complexity of Data Reviewed Labs: ordered. Decision-making details documented in ED Course. Radiology: ordered and independent interpretation performed. Decision-making details documented in ED Course.  Risk Prescription drug management. Decision regarding hospitalization.   Pt  presenting today with a complaint that caries a high risk for morbidity and mortality. Returning within 36 hours of discharge for persistent abdominal pain, recurrence of vomiting and unable to eat.  Patient has not had a fever and is complained of persistent diarrhea.  However during patient's initial admission Tmax was 102.9 and white count was up to 19.  Patient C. difficile was negative and he denies any recent antibiotic use.  All of patient's abdominal pain  is in the upper quadrants.  Not significant lower abdominal pain.  Concern for pancreatitis, cholecystitis, hepatitis, ileus.  Recurrent AKI.  I independently interpreted patient's labs with CBC with leukocytosis of 13.9, hemoglobin of 15, CMP with mild hypokalemia with potassium of 3.2 and normal creatinine and LFTs, lipase also mildly elevated at 137.  UA without signs of infection.  I have independently visualized and interpreted pt's images today.  CT today with dilated loops of bowel but no evidence of pancreatitis or renal stones.  Radiology reports dilated small bowel into the pelvis with gradual caliber change to decompressed distal small bowel.  They report no well-defined focal change but concerning for distal small bowel obstruction.  I consulted general surgery who will consult on the patient.  Patient admitted back to the hospitalist service.  Discussed this with the patient and his wife and they are comfortable with this plan.  Symptoms improved after fluids and pain  control.        Final Clinical Impression(s) / ED Diagnoses Final diagnoses:  Small bowel obstruction [facility])    Rx / DC Orders ED Discharge Orders     None         Gwyneth Sprout, MD 02/18/23 2237

## 2023-02-18 NOTE — ED Triage Notes (Signed)
Pt discharged from inpatient earlier in the week d/t food poisoning/dehydration.  Pt has been advancing diet slowly at home but has severe diffuse abdominal pain and distention that is uncontrolled with OTC medications.  No n/v/d or shob.

## 2023-02-19 ENCOUNTER — Inpatient Hospital Stay (HOSPITAL_COMMUNITY): Payer: No Typology Code available for payment source

## 2023-02-19 DIAGNOSIS — R748 Abnormal levels of other serum enzymes: Secondary | ICD-10-CM | POA: Diagnosis not present

## 2023-02-19 DIAGNOSIS — E876 Hypokalemia: Secondary | ICD-10-CM

## 2023-02-19 DIAGNOSIS — N136 Pyonephrosis: Secondary | ICD-10-CM | POA: Diagnosis present

## 2023-02-19 DIAGNOSIS — G8929 Other chronic pain: Secondary | ICD-10-CM | POA: Diagnosis present

## 2023-02-19 DIAGNOSIS — Z8249 Family history of ischemic heart disease and other diseases of the circulatory system: Secondary | ICD-10-CM | POA: Diagnosis not present

## 2023-02-19 DIAGNOSIS — E872 Acidosis, unspecified: Secondary | ICD-10-CM

## 2023-02-19 DIAGNOSIS — N179 Acute kidney failure, unspecified: Secondary | ICD-10-CM | POA: Diagnosis present

## 2023-02-19 DIAGNOSIS — D72829 Elevated white blood cell count, unspecified: Secondary | ICD-10-CM

## 2023-02-19 DIAGNOSIS — R109 Unspecified abdominal pain: Secondary | ICD-10-CM | POA: Diagnosis present

## 2023-02-19 DIAGNOSIS — K56609 Unspecified intestinal obstruction, unspecified as to partial versus complete obstruction: Secondary | ICD-10-CM | POA: Diagnosis not present

## 2023-02-19 DIAGNOSIS — Z6838 Body mass index (BMI) 38.0-38.9, adult: Secondary | ICD-10-CM | POA: Diagnosis not present

## 2023-02-19 DIAGNOSIS — E860 Dehydration: Secondary | ICD-10-CM | POA: Diagnosis present

## 2023-02-19 DIAGNOSIS — K567 Ileus, unspecified: Secondary | ICD-10-CM | POA: Diagnosis present

## 2023-02-19 DIAGNOSIS — N1831 Chronic kidney disease, stage 3a: Secondary | ICD-10-CM | POA: Diagnosis present

## 2023-02-19 DIAGNOSIS — E669 Obesity, unspecified: Secondary | ICD-10-CM | POA: Diagnosis present

## 2023-02-19 LAB — CBC
HCT: 41.2 % (ref 39.0–52.0)
Hemoglobin: 13.7 g/dL (ref 13.0–17.0)
MCH: 27.9 pg (ref 26.0–34.0)
MCHC: 33.3 g/dL (ref 30.0–36.0)
MCV: 83.9 fL (ref 80.0–100.0)
Platelets: 209 10*3/uL (ref 150–400)
RBC: 4.91 MIL/uL (ref 4.22–5.81)
RDW: 14.2 % (ref 11.5–15.5)
WBC: 11.1 10*3/uL — ABNORMAL HIGH (ref 4.0–10.5)
nRBC: 0 % (ref 0.0–0.2)

## 2023-02-19 LAB — LIPASE, BLOOD: Lipase: 99 U/L — ABNORMAL HIGH (ref 11–51)

## 2023-02-19 LAB — BASIC METABOLIC PANEL
Anion gap: 9 (ref 5–15)
BUN: 10 mg/dL (ref 6–20)
CO2: 18 mmol/L — ABNORMAL LOW (ref 22–32)
Calcium: 8 mg/dL — ABNORMAL LOW (ref 8.9–10.3)
Chloride: 108 mmol/L (ref 98–111)
Creatinine, Ser: 1.04 mg/dL (ref 0.61–1.24)
GFR, Estimated: >60 mL/min (ref >60)
Glucose, Bld: 91 mg/dL (ref 70–99)
Potassium: 3.3 mmol/L — ABNORMAL LOW (ref 3.5–5.1)
Sodium: 135 mmol/L (ref 135–145)

## 2023-02-19 LAB — MAGNESIUM: Magnesium: 1.9 mg/dL (ref 1.7–2.4)

## 2023-02-19 LAB — HIV ANTIBODY (ROUTINE TESTING W REFLEX): HIV Screen 4th Generation wRfx: NONREACTIVE

## 2023-02-19 MED ORDER — ONDANSETRON HCL 4 MG/2ML IJ SOLN
4.0000 mg | Freq: Four times a day (QID) | INTRAMUSCULAR | Status: DC | PRN
  Administered 2023-02-20 – 2023-02-23 (×7): 4 mg via INTRAVENOUS
  Filled 2023-02-19 (×7): qty 2

## 2023-02-19 MED ORDER — HYDROMORPHONE HCL 1 MG/ML IJ SOLN
1.0000 mg | INTRAMUSCULAR | Status: DC | PRN
  Administered 2023-02-19 – 2023-02-23 (×21): 1 mg via INTRAVENOUS
  Filled 2023-02-19 (×21): qty 1

## 2023-02-19 MED ORDER — POTASSIUM CHLORIDE 10 MEQ/100ML IV SOLN
10.0000 meq | INTRAVENOUS | Status: AC
Start: 2023-02-19 — End: 2023-02-19
  Administered 2023-02-19 (×4): 10 meq via INTRAVENOUS
  Filled 2023-02-19 (×4): qty 100

## 2023-02-19 MED ORDER — DIATRIZOATE MEGLUMINE & SODIUM 66-10 % PO SOLN
90.0000 mL | Freq: Once | ORAL | Status: AC
  Administered 2023-02-19: 90 mL via ORAL
  Filled 2023-02-19: qty 90

## 2023-02-19 MED ORDER — KETOROLAC TROMETHAMINE 30 MG/ML IJ SOLN
30.0000 mg | Freq: Once | INTRAMUSCULAR | Status: AC
  Administered 2023-02-19: 30 mg via INTRAVENOUS
  Filled 2023-02-19: qty 1

## 2023-02-19 MED ORDER — SODIUM CHLORIDE 0.9 % IV SOLN: INTRAVENOUS | Status: AC

## 2023-02-19 MED ORDER — HYDROMORPHONE HCL 1 MG/ML IJ SOLN
1.0000 mg | INTRAMUSCULAR | Status: DC | PRN
  Administered 2023-02-19 (×4): 1 mg via INTRAVENOUS
  Filled 2023-02-19 (×4): qty 1

## 2023-02-19 MED ORDER — METHOCARBAMOL 1000 MG/10ML IJ SOLN
500.0000 mg | Freq: Once | INTRAMUSCULAR | Status: AC
  Administered 2023-02-19: 500 mg via INTRAVENOUS
  Filled 2023-02-19: qty 10

## 2023-02-19 MED ORDER — NALOXONE HCL 0.4 MG/ML IJ SOLN: 0.4000 mg | INTRAMUSCULAR | Status: DC | PRN

## 2023-02-19 MED ORDER — LACTATED RINGERS IV SOLN: INTRAVENOUS | Status: AC

## 2023-02-19 NOTE — Consult Note (Signed)
Reason for Consult:abdominal pain Referring Physician: [name] is an 37 y.o. male.  HPI: 31yo M admitted to the hospitalist service earlier this week 12/2 - 12/5 for gastroenteritis, AKI, and orthostasis.  His nausea and vomiting improved and he was discharged home.  He reports he still has been having abdominal pain.  He returned to the emergency department overnight and had 1 episode of emesis there.  He underwent another CT scan of the abdomen pelvis which shows some dilated small bowel with gradual transition to normal caliber in the pelvis without a clear transition point.  Small bowel obstruction was considered and I was asked to see him in consultation.  Since admission, he has had a bowel movement which he described as liquid with chunks and passed gas along with the bowel movement.  He is still having some abdominal pain.  History reviewed. No pertinent past medical history.  Past Surgical History:  Procedure Laterality Date   HERNIA REPAIR      History reviewed. No pertinent family history.  Social History:  reports that he has never smoked. He has never used smokeless tobacco. He reports current alcohol use. He reports that he does not currently use drugs.  Allergies:  Allergies  Allergen Reactions   Dairy Aid [Tilactase] Other (See Comments)    Upset stomach; "tears me up"    Medications: I have reviewed the patient's current medications.  Results for orders placed or performed during the hospital encounter of 02/18/23 (from the past 48 hour(s))  Urinalysis, Routine w reflex microscopic -Urine, Clean Catch     Status: Abnormal   Collection Time: 02/18/23  3:32 PM  Result Value Ref Range   Color, Urine AMBER (A) YELLOW    Comment: BIOCHEMICALS MAY BE AFFECTED BY COLOR   APPearance TURBID (A) CLEAR   Specific Gravity, Urine 1.031 (H) 1.005 - 1.030   pH 5.0 5.0 - 8.0   Glucose, UA NEGATIVE NEGATIVE mg/dL   Hgb urine dipstick SMALL (A) NEGATIVE    Bilirubin Urine SMALL (A) NEGATIVE   Ketones, ur NEGATIVE NEGATIVE mg/dL   Protein, ur 782 (A) NEGATIVE mg/dL   Nitrite NEGATIVE NEGATIVE   Leukocytes,Ua NEGATIVE NEGATIVE   RBC / HPF 0-5 0 - 5 RBC/hpf   WBC, UA 0-5 0 - 5 WBC/hpf   Bacteria, UA NONE SEEN NONE SEEN   Squamous Epithelial / HPF 0-5 0 - 5 /HPF   Mucus PRESENT    Amorphous Crystal PRESENT    Non Squamous Epithelial 0-5 (A) NONE SEEN    Comment: Performed at [facility], [address], [place], [place] [address]  CBC with Differential     Status: Abnormal   Collection Time: 02/18/23  3:37 PM  Result Value Ref Range   WBC 13.9 (H) 4.0 - 10.5 K/uL   RBC 5.45 4.22 - 5.81 MIL/uL   Hemoglobin 15.2 13.0 - 17.0 g/dL   HCT 30.8 65.7 - 84.6 %   MCV 85.7 80.0 - 100.0 fL   MCH 27.9 26.0 - 34.0 pg   MCHC 32.5 30.0 - 36.0 g/dL   RDW 96.2 95.2 - 84.1 %   Platelets 266 150 - 400 K/uL   nRBC 0.0 0.0 - 0.2 %   Neutrophils Relative % 73 %   Neutro Abs 10.2 (H) 1.7 - 7.7 K/uL   Lymphocytes Relative 12 %   Lymphs Abs 1.7 0.7 - 4.0 K/uL   Monocytes Relative 12 %   Monocytes Absolute  1.6 (H) 0.1 - 1.0 K/uL   Eosinophils Relative 1 %   Eosinophils Absolute 0.2 0.0 - 0.5 K/uL   Basophils Relative 1 %   Basophils Absolute 0.1 0.0 - 0.1 K/uL   Immature Granulocytes 1 %   Abs Immature Granulocytes 0.11 (H) 0.00 - 0.07 K/uL    Comment: Performed at [facility], [address], [place], [place] [address]  Comprehensive metabolic panel     Status: Abnormal   Collection Time: 02/18/23  3:37 PM  Result Value Ref Range   Sodium 138 135 - 145 mmol/L   Potassium 3.2 (L) 3.5 - 5.1 mmol/L   Chloride 107 98 - 111 mmol/L   CO2 20 (L) 22 - 32 mmol/L   Glucose, Bld 96 70 - 99 mg/dL    Comment: Glucose reference range applies only to samples taken after fasting for at least 8 hours.   BUN 13 6 - 20 mg/dL   Creatinine, Ser 6.04 0.61 - 1.24 mg/dL   Calcium 8.9 8.9 - 54.0 mg/dL   Total Protein 7.0 6.5 - 8.1 g/dL   Albumin 3.6 3.5 -  5.0 g/dL   AST 18 15 - 41 U/L   ALT 34 0 - 44 U/L   Alkaline Phosphatase 62 38 - 126 U/L   Total Bilirubin 1.0 <1.2 mg/dL   GFR, Estimated >98 >11 mL/min    Comment: (NOTE) Calculated using the CKD-EPI Creatinine Equation (2021)    Anion gap 11 5 - 15    Comment: Performed at [facility], [address], [place], [place] [address]  Lipase, blood     Status: Abnormal   Collection Time: 02/18/23  3:37 PM  Result Value Ref Range   Lipase 137 (H) 11 - 51 U/L    Comment: Performed at [facility], [address] [place], [place] [address]    CT ABDOMEN PELVIS W CONTRAST  Result Date: 02/18/2023 CLINICAL DATA:  Abdominal pain, acute, nonlocalized EXAM: CT ABDOMEN AND PELVIS WITH CONTRAST TECHNIQUE: Multidetector CT imaging of the abdomen and pelvis was performed using the standard protocol following bolus administration of intravenous contrast. RADIATION DOSE REDUCTION: This exam was performed according to the departmental dose-optimization program which includes automated exposure control, adjustment of the mA and/or kV according to patient size and/or use of iterative reconstruction technique. CONTRAST:  75mL OMNIPAQUE IOHEXOL 350 MG/ML SOLN COMPARISON:  02/14/2023 FINDINGS: Lower chest: No acute abnormality. Small fluid-filled hiatal hernia. Hepatobiliary: No focal hepatic abnormality. Gallbladder unremarkable. Pancreas: No focal abnormality or ductal dilatation. Spleen: No focal abnormality.  Normal size. Adrenals/Urinary Tract: No adrenal abnormality. No focal renal abnormality. No stones or hydronephrosis. Urinary bladder is unremarkable. Stomach/Bowel: Normal appendix. There are dilated fluid-filled small bowel loops into the pelvis measuring up 2 4 cm in diameter. Distal small bowel is decompressed. This is concerning for distal small bowel obstruction. Large bowel is fluid-filled but predominantly decompressed. Stomach decompressed. Vascular/Lymphatic: No evidence of aneurysm or  adenopathy. Reproductive: No visible focal abnormality. Other: No free fluid or free air. Musculoskeletal: No acute bony abnormality. IMPRESSION: Dilated small bowel into the pelvis with gradual caliber change to decompressed distal small bowel. While there is no well-defined focal change in bowel caliber, appearance is concerning for distal small-bowel obstruction. Electronically Signed   By: Charlett Nose M.D.   On: 02/18/2023 21:22    Review of Systems  Constitutional:  Positive for appetite change.  HENT: Negative.    Eyes: Negative.  Respiratory: Negative.    Cardiovascular: Negative.   Gastrointestinal:  Positive for abdominal distention, abdominal pain, diarrhea, nausea and vomiting.  Endocrine: Negative.   Genitourinary: Negative.   Musculoskeletal: Negative.   Skin: Negative.   Allergic/Immunologic: Negative.   Neurological: Negative.   Hematological: Negative.   Psychiatric/Behavioral: Negative.     Blood pressure (!) 120/92, pulse 89, temperature 98.4 F (36.9 C), temperature source Oral, resp. rate 18, SpO2 100%. Physical Exam HENT:     Head: Normocephalic.  Cardiovascular:     Rate and Rhythm: Normal rate and regular rhythm.  Pulmonary:     Effort: Pulmonary effort is normal.     Breath sounds: Normal breath sounds.  Abdominal:     General: Bowel sounds are normal. There is distension.     Palpations: Abdomen is soft.     Tenderness: There is generalized abdominal tenderness. There is no guarding or rebound.     Comments: Some abdominal distention, mild tenderness throughout without guarding or peritonitis  Skin:    General: Skin is warm.  Neurological:     Mental Status: He is alert and oriented to person, place, and time.  Psychiatric:        Mood and Affect: Mood normal.     Assessment/Plan: SBO versus enteritis -no current nausea.  Agree with admission to the hospitalist service, IV fluids, bowel rest.  Will proceed with SBO protocol with oral contrast.  No  need for emergent surgery but we will follow with you.  Liz Malady 02/19/2023, 4:26 AM

## 2023-02-19 NOTE — Progress Notes (Signed)
Subjective: Patient admitted this morning, see detailed H&P by Dr Loney Loh 37 y.o. male with medical history significant of obesity, hernia repair surgery.  Recently admitted 12/2-12/5 for gastroenteritis, AKI, and orthostatic syncope.  Patient returns to the ED complaining of worsening abdominal pain, vomiting, and persistent diarrhea.  GI pathogen panel and C. difficile testing were negative during recent hospitalization.   Vitals:   02/19/23 0440 02/19/23 0826  BP: 124/81 112/78  Pulse: 84 91  Resp: 17 20  Temp: 98.8 F (37.1 C) 99 F (37.2 C)  SpO2: 97% 96%      A/P Distal SBO Elevated lipase  General Surgery consulted, started on SBO protocol with oral contrast  no plan for surgical intervention Patient had loose BMs this morning also passing flatus Will start on clear liquid diet    Meredeth Ide [facility]

## 2023-02-19 NOTE — H&P (Signed)
History and Physical    Russell Stephens [name]:[id number] DOB: Jun 08, 1985 DOA: 02/18/2023  PCP: Kaleen Mask, MD  Patient coming from: Home  Chief Complaint: Abdominal pain  HPI: Russell Stephens is a 37 y.o. male with medical history significant of obesity, hernia repair surgery.  Recently admitted 12/2-12/5 for gastroenteritis, AKI, and orthostatic syncope.  Patient returns to the ED complaining of worsening abdominal pain, vomiting, and persistent diarrhea.  GI pathogen panel and C. difficile testing were negative during recent hospitalization.  In the ED, patient noted to be afebrile.  Labs notable for WBC count 13.9, potassium 3.2, bicarb 20, anion gap 11, glucose 96, creatinine 1.2, lipase 137, normal LFTs, UA not suggestive of infection.  CT abdomen pelvis showing findings concerning for distal small bowel obstruction.  General surgery consulted (Dr. Janee Morn). Patient was given Dilaudid, Zofran, Protonix, and 1 L normal saline.  [facility] called to admit.  Patient states his abdominal pain never improved since after he left the hospital and became progressively worse.  Diarrhea has slowed down.  He had 1 episode of vomiting in the ED but no further vomiting since then and denies any nausea.  He continues to have severe generalized abdominal pain and requesting pain medication.  Reports history of hiatal hernia surgery as a child at the age of 3 but no other abdominal surgeries.  Denies fevers or chills.  Denies cough, shortness of breath, or chest pain.  Review of Systems:  Review of Systems  All other systems reviewed and are negative.   History reviewed. No pertinent past medical history.  Past Surgical History:  Procedure Laterality Date   HERNIA REPAIR       reports that he has never smoked. He has never used smokeless tobacco. He reports current alcohol use. He reports that he does not currently use drugs.  Allergies  Allergen Reactions   Dairy Aid [Tilactase] Other (See  Comments)    Upset stomach; "tears me up"    History reviewed. No pertinent family history.  Prior to Admission medications   Not on File    Physical Exam: Vitals:   02/18/23 1942 02/18/23 2014 02/18/23 2036 02/19/23 0005  BP: 129/81  (!) 139/99 (!) 120/92  Pulse: (!) 102  100 89  Resp: 17  18 18   Temp: 98.7 F (37.1 C)  98.2 F (36.8 C) 98.4 F (36.9 C)  TempSrc:   Oral Oral  SpO2: 99% 99% 100% 100%    Physical Exam Vitals reviewed.  Constitutional:      General: He is not in acute distress. HENT:     Head: Normocephalic and atraumatic.  Eyes:     Extraocular Movements: Extraocular movements intact.  Cardiovascular:     Rate and Rhythm: Normal rate and regular rhythm.     Pulses: Normal pulses.  Pulmonary:     Effort: Pulmonary effort is normal. No respiratory distress.     Breath sounds: Normal breath sounds. No wheezing or rales.  Abdominal:     General: There is distension.     Comments: Generalized tenderness to palpation Bowel sounds present  Musculoskeletal:     Cervical back: Normal range of motion.     Right lower leg: No edema.     Left lower leg: No edema.  Skin:    General: Skin is warm and dry.  Neurological:     General: No focal deficit present.     Mental Status: He is alert and oriented to person, place, and time.  Labs on Admission: I have personally reviewed following labs and imaging studies  CBC: Recent Labs  Lab 02/14/23 1409 02/15/23 0350 02/16/23 0349 02/17/23 0339 02/18/23 1537  WBC 19.4* 12.8* 6.5 6.0 13.9*  NEUTROABS  --  10.7*  --   --  10.2*  HGB 16.9 14.1 14.5 13.7 15.2  HCT 53.1* 44.3 45.3 43.3 46.7  MCV 88.1 86.4 85.5 86.9 85.7  PLT 253 166 197 182 266   Basic Metabolic Panel: Recent Labs  Lab 02/14/23 1409 02/15/23 0350 02/16/23 0349 02/17/23 0339 02/18/23 1537  NA 136 137 135 138 138  K 4.2 3.6 3.5 3.5 3.2*  CL 106 111 104 110 107  CO2 19* 18* 23 19* 20*  GLUCOSE 144* 124* 115* 99 96  BUN 21* 19  18 16 13   CREATININE 1.91* 1.31* 1.25* 1.16 1.23  CALCIUM 9.8 7.8* 8.1* 8.7* 8.9  MG  --  1.2*  --   --   --    GFR: Estimated Creatinine Clearance: 103.1 mL/min (by C-G formula based on SCr of 1.23 mg/dL). Liver Function Tests: Recent Labs  Lab 02/16/23 0349 02/17/23 0339 02/18/23 1537  AST 30 12* 18  ALT 52* 32 34  ALKPHOS 54 47 62  BILITOT 0.6 0.4 1.0  PROT 6.4* 6.7 7.0  ALBUMIN 3.4* 3.2* 3.6   Recent Labs  Lab 02/18/23 1537  LIPASE 137*   No results for input(s): "AMMONIA" in the last 168 hours. Coagulation Profile: No results for input(s): "INR", "PROTIME" in the last 168 hours. Cardiac Enzymes: No results for input(s): "CKTOTAL", "CKMB", "CKMBINDEX", "TROPONINI" in the last 168 hours. BNP (last 3 results) No results for input(s): "PROBNP" in the last 8760 hours. HbA1C: No results for input(s): "HGBA1C" in the last 72 hours. CBG: Recent Labs  Lab 02/14/23 1414 02/15/23 0802  GLUCAP 166* 149*   Lipid Profile: No results for input(s): "CHOL", "HDL", "LDLCALC", "TRIG", "CHOLHDL", "LDLDIRECT" in the last 72 hours. Thyroid Function Tests: No results for input(s): "TSH", "T4TOTAL", "FREET4", "T3FREE", "THYROIDAB" in the last 72 hours. Anemia Panel: No results for input(s): "VITAMINB12", "FOLATE", "FERRITIN", "TIBC", "IRON", "RETICCTPCT" in the last 72 hours. Urine analysis:    Component Value Date/Time   COLORURINE AMBER (A) 02/18/2023 1532   APPEARANCEUR TURBID (A) 02/18/2023 1532   LABSPEC 1.031 (H) 02/18/2023 1532   PHURINE 5.0 02/18/2023 1532   GLUCOSEU NEGATIVE 02/18/2023 1532   HGBUR SMALL (A) 02/18/2023 1532   BILIRUBINUR SMALL (A) 02/18/2023 1532   KETONESUR NEGATIVE 02/18/2023 1532   PROTEINUR 100 (A) 02/18/2023 1532   NITRITE NEGATIVE 02/18/2023 1532   LEUKOCYTESUR NEGATIVE 02/18/2023 1532    Radiological Exams on Admission: CT ABDOMEN PELVIS W CONTRAST  Result Date: 02/18/2023 CLINICAL DATA:  Abdominal pain, acute, nonlocalized EXAM: CT  ABDOMEN AND PELVIS WITH CONTRAST TECHNIQUE: Multidetector CT imaging of the abdomen and pelvis was performed using the standard protocol following bolus administration of intravenous contrast. RADIATION DOSE REDUCTION: This exam was performed according to the departmental dose-optimization program which includes automated exposure control, adjustment of the mA and/or kV according to patient size and/or use of iterative reconstruction technique. CONTRAST:  75mL OMNIPAQUE IOHEXOL 350 MG/ML SOLN COMPARISON:  02/14/2023 FINDINGS: Lower chest: No acute abnormality. Small fluid-filled hiatal hernia. Hepatobiliary: No focal hepatic abnormality. Gallbladder unremarkable. Pancreas: No focal abnormality or ductal dilatation. Spleen: No focal abnormality.  Normal size. Adrenals/Urinary Tract: No adrenal abnormality. No focal renal abnormality. No stones or hydronephrosis. Urinary bladder is unremarkable. Stomach/Bowel: Normal appendix. There are dilated  fluid-filled small bowel loops into the pelvis measuring up 2 4 cm in diameter. Distal small bowel is decompressed. This is concerning for distal small bowel obstruction. Large bowel is fluid-filled but predominantly decompressed. Stomach decompressed. Vascular/Lymphatic: No evidence of aneurysm or adenopathy. Reproductive: No visible focal abnormality. Other: No free fluid or free air. Musculoskeletal: No acute bony abnormality. IMPRESSION: Dilated small bowel into the pelvis with gradual caliber change to decompressed distal small bowel. While there is no well-defined focal change in bowel caliber, appearance is concerning for distal small-bowel obstruction. Electronically Signed   By: Charlett Nose M.D.   On: 02/18/2023 21:22    Assessment and Plan  Distal SBO Patient had 1 episode of vomiting in the ED but no recurrence since then.  General surgery consulted, defer decision regarding NG tube placement to general surgery team.  Keep n.p.o. IV fluid hydration, antiemetic  as needed (EKG ordered to check QT interval), and continue pain management.  Mild leukocytosis Likely reactive.  No signs of infection.  Repeat CBC in the morning.  Mild hypokalemia Monitor potassium and magnesium levels, continue to replace as needed.  Mild normal anion gap metabolic acidosis Likely related to recent diarrhea.  Continue to monitor metabolic panel.  Elevated lipase No evidence of pancreatitis on CT.  Repeat lipase level in the morning.  DVT prophylaxis: SCDs Code Status: Full Code (discussed with the patient) Consults called: General Surgery Level of care: Telemetry bed Admission status: It is my clinical opinion that admission to INPATIENT is reasonable and necessary because of the expectation that this patient will require hospital care that crosses at least 2 midnights to treat this condition based on the medical complexity of the problems presented.  Given the aforementioned information, the predictability of an adverse outcome is felt to be significant.  John Giovanni MD [facility]  If 7PM-7AM, please contact night-coverage [email or web address]  02/19/2023, 1:33 AM

## 2023-02-20 ENCOUNTER — Inpatient Hospital Stay (HOSPITAL_COMMUNITY): Payer: No Typology Code available for payment source

## 2023-02-20 DIAGNOSIS — E876 Hypokalemia: Secondary | ICD-10-CM | POA: Diagnosis not present

## 2023-02-20 DIAGNOSIS — R748 Abnormal levels of other serum enzymes: Secondary | ICD-10-CM

## 2023-02-20 DIAGNOSIS — E872 Acidosis, unspecified: Secondary | ICD-10-CM | POA: Diagnosis not present

## 2023-02-20 DIAGNOSIS — K56609 Unspecified intestinal obstruction, unspecified as to partial versus complete obstruction: Secondary | ICD-10-CM | POA: Diagnosis not present

## 2023-02-20 LAB — CBC
HCT: 38.4 % — ABNORMAL LOW (ref 39.0–52.0)
Hemoglobin: 12.8 g/dL — ABNORMAL LOW (ref 13.0–17.0)
MCH: 28.1 pg (ref 26.0–34.0)
MCHC: 33.3 g/dL (ref 30.0–36.0)
MCV: 84.2 fL (ref 80.0–100.0)
Platelets: 196 10*3/uL (ref 150–400)
RBC: 4.56 MIL/uL (ref 4.22–5.81)
RDW: 13.8 % (ref 11.5–15.5)
WBC: 12.3 10*3/uL — ABNORMAL HIGH (ref 4.0–10.5)
nRBC: 0 % (ref 0.0–0.2)

## 2023-02-20 LAB — COMPREHENSIVE METABOLIC PANEL
ALT: 36 U/L (ref 0–44)
AST: 19 U/L (ref 15–41)
Albumin: 2.7 g/dL — ABNORMAL LOW (ref 3.5–5.0)
Alkaline Phosphatase: 54 U/L (ref 38–126)
Anion gap: 7 (ref 5–15)
BUN: 8 mg/dL (ref 6–20)
CO2: 21 mmol/L — ABNORMAL LOW (ref 22–32)
Calcium: 8 mg/dL — ABNORMAL LOW (ref 8.9–10.3)
Chloride: 108 mmol/L (ref 98–111)
Creatinine, Ser: 1.5 mg/dL — ABNORMAL HIGH (ref 0.61–1.24)
GFR, Estimated: >60 mL/min (ref >60)
Glucose, Bld: 91 mg/dL (ref 70–99)
Potassium: 3.2 mmol/L — ABNORMAL LOW (ref 3.5–5.1)
Sodium: 136 mmol/L (ref 135–145)
Total Bilirubin: 0.7 mg/dL (ref <1.2)
Total Protein: 5.5 g/dL — ABNORMAL LOW (ref 6.5–8.1)

## 2023-02-20 MED ORDER — POTASSIUM CHLORIDE CRYS ER 20 MEQ PO TBCR
40.0000 meq | EXTENDED_RELEASE_TABLET | Freq: Once | ORAL | Status: AC
  Administered 2023-02-20: 40 meq via ORAL
  Filled 2023-02-20: qty 2

## 2023-02-20 MED ORDER — METRONIDAZOLE 500 MG/100ML IV SOLN
500.0000 mg | Freq: Two times a day (BID) | INTRAVENOUS | Status: DC
  Administered 2023-02-20 – 2023-02-22 (×5): 500 mg via INTRAVENOUS
  Filled 2023-02-20 (×5): qty 100

## 2023-02-20 MED ORDER — CIPROFLOXACIN IN D5W 400 MG/200ML IV SOLN
400.0000 mg | Freq: Two times a day (BID) | INTRAVENOUS | Status: DC
  Administered 2023-02-20 – 2023-02-22 (×5): 400 mg via INTRAVENOUS
  Filled 2023-02-20 (×6): qty 200

## 2023-02-20 MED ORDER — METHOCARBAMOL 500 MG PO TABS
500.0000 mg | ORAL_TABLET | Freq: Three times a day (TID) | ORAL | Status: DC
  Administered 2023-02-20 – 2023-02-24 (×12): 500 mg via ORAL
  Filled 2023-02-20 (×12): qty 1

## 2023-02-20 MED ORDER — ENOXAPARIN SODIUM 40 MG/0.4ML IJ SOSY
40.0000 mg | PREFILLED_SYRINGE | INTRAMUSCULAR | Status: DC
  Administered 2023-02-20 – 2023-02-24 (×5): 40 mg via SUBCUTANEOUS
  Filled 2023-02-20 (×5): qty 0.4

## 2023-02-20 NOTE — Progress Notes (Signed)
Subjective/Chief Complaint: Still having pain radiating from his right flank down towards his pelvis Several episodes of diarrhea SBO protocol film shows contrast in the rectum - no obstruction WBC still elevated  Objective: Vital signs in last 24 hours: Temp:  [98.2 F (36.8 C)-99 F (37.2 C)] 98.8 F (37.1 C) (12/08 0738) Pulse Rate:  [70-91] 73 (12/08 0738) Resp:  [17-20] 18 (12/08 0738) BP: (112-156)/(78-95) 138/95 (12/08 0738) SpO2:  [93 %-97 %] 97 % (12/08 0738) Last BM Date : 02/19/23  Intake/Output from previous day: 12/07 0701 - 12/08 0700 In: 2913.9 [P.O.:240; I.V.:2673.9] Out: -  Intake/Output this shift: Total I/O In: 251.1 [I.V.:251.1] Out: -   WDWN in NAD Abd - soft, mildly tender throughout abdomen without peritonitis  Lab Results:  Recent Labs    02/19/23 0430 02/20/23 0656  WBC 11.1* 12.3*  HGB 13.7 12.8*  HCT 41.2 38.4*  PLT 209 196   BMET Recent Labs    02/19/23 0430 02/20/23 0656  NA 135 136  K 3.3* 3.2*  CL 108 108  CO2 18* 21*  GLUCOSE 91 91  BUN 10 8  CREATININE 1.04 1.50*  CALCIUM 8.0* 8.0*   PT/INR No results for input(s): "LABPROT", "INR" in the last 72 hours. ABG No results for input(s): "PHART", "HCO3" in the last 72 hours.  Invalid input(s): "PCO2", "PO2"  Studies/Results: DG Abd Portable 1V-Small Bowel Obstruction Protocol-initial, 8 hr delay  Result Date: 02/19/2023 CLINICAL DATA:  Re-evaluate small-bowel obstruction. EXAM: PORTABLE ABDOMEN - 1 VIEW COMPARISON:  CT from yesterday FINDINGS: 2 supine views of the abdomen and pelvis. Contrast has passed into a normal caliber colon, including to the level of the rectum. Minimal residual small bowel contrast identified. No significant small bowel dilatation. No gross free intraperitoneal air. IMPRESSION: Passage of contrast to the level of the rectum, arguing against clinically significant obstruction. Electronically Signed   By: Jeronimo Greaves M.D.   On: 02/19/2023 13:51    CT ABDOMEN PELVIS W CONTRAST  Result Date: 02/18/2023 CLINICAL DATA:  Abdominal pain, acute, nonlocalized EXAM: CT ABDOMEN AND PELVIS WITH CONTRAST TECHNIQUE: Multidetector CT imaging of the abdomen and pelvis was performed using the standard protocol following bolus administration of intravenous contrast. RADIATION DOSE REDUCTION: This exam was performed according to the departmental dose-optimization program which includes automated exposure control, adjustment of the mA and/or kV according to patient size and/or use of iterative reconstruction technique. CONTRAST:  75mL OMNIPAQUE IOHEXOL 350 MG/ML SOLN COMPARISON:  02/14/2023 FINDINGS: Lower chest: No acute abnormality. Small fluid-filled hiatal hernia. Hepatobiliary: No focal hepatic abnormality. Gallbladder unremarkable. Pancreas: No focal abnormality or ductal dilatation. Spleen: No focal abnormality.  Normal size. Adrenals/Urinary Tract: No adrenal abnormality. No focal renal abnormality. No stones or hydronephrosis. Urinary bladder is unremarkable. Stomach/Bowel: Normal appendix. There are dilated fluid-filled small bowel loops into the pelvis measuring up 2 4 cm in diameter. Distal small bowel is decompressed. This is concerning for distal small bowel obstruction. Large bowel is fluid-filled but predominantly decompressed. Stomach decompressed. Vascular/Lymphatic: No evidence of aneurysm or adenopathy. Reproductive: No visible focal abnormality. Other: No free fluid or free air. Musculoskeletal: No acute bony abnormality. IMPRESSION: Dilated small bowel into the pelvis with gradual caliber change to decompressed distal small bowel. While there is no well-defined focal change in bowel caliber, appearance is concerning for distal small-bowel obstruction. Electronically Signed   By: Charlett Nose M.D.   On: 02/18/2023 21:22    Anti-infectives: Anti-infectives (From admission, onward)    None  Assessment/Plan: No clinical evidence of small  bowel obstruction Would still favor infectious or inflammatory process - consider GI consult and empiric antibiotics No indications for surgical intervention   LOS: 1 day    Russell Stephens 02/20/2023

## 2023-02-20 NOTE — Progress Notes (Incomplete)
[facility]  PROGRESS NOTE  Russell Stephens ZOX:[id number] DOB: 1985-09-02 DOA: 02/18/2023 PCP: Kaleen Mask, MD   Brief HPI:   ***    Assessment/Plan:   ***    Medications      Data Reviewed:   CBG:  Recent Labs  Lab 02/14/23 1414 02/15/23 0802  GLUCAP 166* 149*    SpO2: 97 %    Vitals:   02/19/23 1451 02/19/23 2113 02/20/23 0451 02/20/23 0738  BP: (!) 134/90 (!) 128/92 (!) 156/93 (!) 138/95  Pulse: 83 73 70 73  Resp: 20 17 18 18   Temp: 98.2 F (36.8 C) 98.7 F (37.1 C) 99 F (37.2 C) 98.8 F (37.1 C)  TempSrc: Oral Oral Oral Oral  SpO2: 97% 93% 97% 97%      Data Reviewed:  Basic Metabolic Panel: Recent Labs  Lab 02/15/23 0350 02/16/23 0349 02/17/23 0339 02/18/23 1537 02/19/23 0430 02/20/23 0656  NA 137 135 138 138 135 136  K 3.6 3.5 3.5 3.2* 3.3* 3.2*  CL 111 104 110 107 108 108  CO2 18* 23 19* 20* 18* 21*  GLUCOSE 124* 115* 99 96 91 91  BUN 19 18 16 13 10 8   CREATININE 1.31* 1.25* 1.16 1.23 1.04 1.50*  CALCIUM 7.8* 8.1* 8.7* 8.9 8.0* 8.0*  MG 1.2*  --   --   --  1.9  --     CBC: Recent Labs  Lab 02/15/23 0350 02/16/23 0349 02/17/23 0339 02/18/23 1537 02/19/23 0430 02/20/23 0656  WBC 12.8* 6.5 6.0 13.9* 11.1* 12.3*  NEUTROABS 10.7*  --   --  10.2*  --   --   HGB 14.1 14.5 13.7 15.2 13.7 12.8*  HCT 44.3 45.3 43.3 46.7 41.2 38.4*  MCV 86.4 85.5 86.9 85.7 83.9 84.2  PLT 166 197 182 266 209 196    LFT Recent Labs  Lab 02/16/23 0349 02/17/23 0339 02/18/23 1537 02/20/23 0656  AST 30 12* 18 19  ALT 52* 32 34 36  ALKPHOS 54 47 62 54  BILITOT 0.6 0.4 1.0 0.7  PROT 6.4* 6.7 7.0 5.5*  ALBUMIN 3.4* 3.2* 3.6 2.7*     Antibiotics: Anti-infectives (From admission, onward)    Start     Dose/Rate Route Frequency Ordered Stop   02/20/23 0915  ciprofloxacin (CIPRO) IVPB 400 mg        400 mg 200 mL/hr over 60 Minutes Intravenous Every 12 hours 02/20/23 0817     02/20/23 0915  metroNIDAZOLE (FLAGYL) IVPB 500 mg         500 mg 100 mL/hr over 60 Minutes Intravenous Every 12 hours 02/20/23 0817          DVT prophylaxis: ***  Code Status: ***  Family Communication: ***   CONSULTS ***   Subjective   ***   Objective    Physical Examination:   General:  *** Cardiovascular: *** Respiratory: *** Abdomen: *** Extremities: ***  Neurologic:  ***   Status is: Inpatient:  ***           Austyn Seier S Rondo Spittler   [facility] If 7PM-7AM, please contact night-coverage at [email or web address], Office  [phone]   02/20/2023, 11:31 AM  LOS: 1 day

## 2023-02-20 NOTE — Plan of Care (Signed)
  Problem: Pain Management: Goal: General experience of comfort will improve Outcome: Progressing   Problem: Safety: Goal: Ability to remain free from injury will improve Outcome: Progressing

## 2023-02-20 NOTE — Progress Notes (Signed)
[facility]  PROGRESS NOTE  Russell Stephens YQM:[id number] DOB: 15-Feb-1986 DOA: 02/18/2023 PCP: Kaleen Mask, MD   Brief HPI:   37 y.o. male with medical history significant of obesity, hernia repair surgery.  Recently admitted 12/2-12/5 for gastroenteritis, AKI, and orthostatic syncope.  Patient returns to the ED complaining of worsening abdominal pain, vomiting, and persistent diarrhea.  GI pathogen panel and C. difficile testing were negative during recent hospitalization.     Assessment/Plan:    Distal SBO/ileus -Resolved, having BMs. -General Surgery has signed off -Surgery recommended to start antibiotics for possible enteritis -Will start ciprofloxacin and Flagyl  Right lumbar pain -Patient has history of disc herniation, complains of pain in the right lower back -Obtain lumbar spine x-ray -Start Robaxin as needed  Acute kidney injury versus CKD stage IIIa -Creatinine elevated today at 1.50 -Patient has been taking ibuprofen intermittently for past many years for back pain -Will obtain renal ultrasound -Need to monitor creatinine as outpatient; may need nephrology consult as outpatient  Elevated lipase -Improved, lipase is down to 99   Medications      Data Reviewed:   CBG:  Recent Labs  Lab 02/14/23 1414 02/15/23 0802  GLUCAP 166* 149*    SpO2: 97 %    Vitals:   02/19/23 1451 02/19/23 2113 02/20/23 0451 02/20/23 0738  BP: (!) 134/90 (!) 128/92 (!) 156/93 (!) 138/95  Pulse: 83 73 70 73  Resp: 20 17 18 18   Temp: 98.2 F (36.8 C) 98.7 F (37.1 C) 99 F (37.2 C) 98.8 F (37.1 C)  TempSrc: Oral Oral Oral Oral  SpO2: 97% 93% 97% 97%      Data Reviewed:  Basic Metabolic Panel: Recent Labs  Lab 02/15/23 0350 02/16/23 0349 02/17/23 0339 02/18/23 1537 02/19/23 0430 02/20/23 0656  NA 137 135 138 138 135 136  K 3.6 3.5 3.5 3.2* 3.3* 3.2*  CL 111 104 110 107 108 108  CO2 18* 23 19* 20* 18* 21*  GLUCOSE 124* 115* 99 96 91 91   BUN 19 18 16 13 10 8   CREATININE 1.31* 1.25* 1.16 1.23 1.04 1.50*  CALCIUM 7.8* 8.1* 8.7* 8.9 8.0* 8.0*  MG 1.2*  --   --   --  1.9  --     CBC: Recent Labs  Lab 02/15/23 0350 02/16/23 0349 02/17/23 0339 02/18/23 1537 02/19/23 0430 02/20/23 0656  WBC 12.8* 6.5 6.0 13.9* 11.1* 12.3*  NEUTROABS 10.7*  --   --  10.2*  --   --   HGB 14.1 14.5 13.7 15.2 13.7 12.8*  HCT 44.3 45.3 43.3 46.7 41.2 38.4*  MCV 86.4 85.5 86.9 85.7 83.9 84.2  PLT 166 197 182 266 209 196    LFT Recent Labs  Lab 02/16/23 0349 02/17/23 0339 02/18/23 1537 02/20/23 0656  AST 30 12* 18 19  ALT 52* 32 34 36  ALKPHOS 54 47 62 54  BILITOT 0.6 0.4 1.0 0.7  PROT 6.4* 6.7 7.0 5.5*  ALBUMIN 3.4* 3.2* 3.6 2.7*     Antibiotics: Anti-infectives (From admission, onward)    Start     Dose/Rate Route Frequency Ordered Stop   02/20/23 0915  ciprofloxacin (CIPRO) IVPB 400 mg        400 mg 200 mL/hr over 60 Minutes Intravenous Every 12 hours 02/20/23 0817     02/20/23 0915  metroNIDAZOLE (FLAGYL) IVPB 500 mg        500 mg 100 mL/hr over 60 Minutes Intravenous Every 12 hours 02/20/23  5284          DVT prophylaxis: Lovenox  Code Status: Full code  Family Communication: No family at bedside   CONSULTS General Surgery   Subjective   Complains of right sided back pain, does have history of disc herniation in the past.   Objective    Physical Examination:   General-appears in no acute distress Heart-S1-S2, regular, no murmur auscultated Lungs-clear to auscultation bilaterally, no wheezing or crackles auscultated Abdomen-soft, nontender, no organomegaly Extremities-no edema in the lower extremities Neuro-alert, oriented x3, no focal deficit noted Back-tenderness in right lower lumbar region  Status is: Inpatient:             Russell Stephens   [facility] If 7PM-7AM, please contact night-coverage at [email or web address], Office  [phone]   02/20/2023, 4:39 PM  LOS: 1 day

## 2023-02-21 ENCOUNTER — Inpatient Hospital Stay (HOSPITAL_COMMUNITY): Payer: No Typology Code available for payment source

## 2023-02-21 DIAGNOSIS — R748 Abnormal levels of other serum enzymes: Secondary | ICD-10-CM | POA: Diagnosis not present

## 2023-02-21 DIAGNOSIS — E876 Hypokalemia: Secondary | ICD-10-CM | POA: Diagnosis not present

## 2023-02-21 DIAGNOSIS — K56609 Unspecified intestinal obstruction, unspecified as to partial versus complete obstruction: Secondary | ICD-10-CM | POA: Diagnosis not present

## 2023-02-21 DIAGNOSIS — E872 Acidosis, unspecified: Secondary | ICD-10-CM | POA: Diagnosis not present

## 2023-02-21 DIAGNOSIS — N179 Acute kidney failure, unspecified: Secondary | ICD-10-CM

## 2023-02-21 LAB — BASIC METABOLIC PANEL
Anion gap: 9 (ref 5–15)
BUN: 7 mg/dL (ref 6–20)
CO2: 24 mmol/L (ref 22–32)
Calcium: 8.2 mg/dL — ABNORMAL LOW (ref 8.9–10.3)
Chloride: 104 mmol/L (ref 98–111)
Creatinine, Ser: 1.83 mg/dL — ABNORMAL HIGH (ref 0.61–1.24)
GFR, Estimated: 48 mL/min — ABNORMAL LOW (ref >60)
Glucose, Bld: 99 mg/dL (ref 70–99)
Potassium: 3.3 mmol/L — ABNORMAL LOW (ref 3.5–5.1)
Sodium: 137 mmol/L (ref 135–145)

## 2023-02-21 LAB — CBC
HCT: 40.4 % (ref 39.0–52.0)
Hemoglobin: 13.4 g/dL (ref 13.0–17.0)
MCH: 27.9 pg (ref 26.0–34.0)
MCHC: 33.2 g/dL (ref 30.0–36.0)
MCV: 84 fL (ref 80.0–100.0)
Platelets: 206 10*3/uL (ref 150–400)
RBC: 4.81 MIL/uL (ref 4.22–5.81)
RDW: 13.8 % (ref 11.5–15.5)
WBC: 10.8 10*3/uL — ABNORMAL HIGH (ref 4.0–10.5)
nRBC: 0 % (ref 0.0–0.2)

## 2023-02-21 MED ORDER — POTASSIUM CHLORIDE CRYS ER 20 MEQ PO TBCR
40.0000 meq | EXTENDED_RELEASE_TABLET | Freq: Once | ORAL | Status: AC
  Administered 2023-02-21: 40 meq via ORAL
  Filled 2023-02-21: qty 2

## 2023-02-21 NOTE — Plan of Care (Signed)

## 2023-02-21 NOTE — Progress Notes (Addendum)
[facility] Progress Note     Subjective: CC:  Feels he is slowly improving reports right mid back pain that is improved compared to when he arrived here. When he got here he says he had severe mid back/flank pain along with testicular pain and he  feels convinced that he passed a kidney stone. Says his mother gets kidney stones and his symptoms were similar. States it feels different than his chronic back pain. He reports mild nausea that started when he was put on abx. Denies vomiting. Denies BM but has had some flatus. Has also been burping. Denies urinary sxs at present  States he had surgery for hiatal hernia when he was 37 years old. Noted creatinine up to 1.81 and renal U/S showing mild dilation of the right collection system.   Objective: Vital signs in last 24 hours: Temp:  [98.2 F (36.8 C)-98.9 F (37.2 C)] 98.6 F (37 C) (12/09 0513) Pulse Rate:  [63-78] 63 (12/09 0513) Resp:  [17-18] 17 (12/09 0513) BP: (127-138)/(81-95) 127/87 (12/09 0513) SpO2:  [95 %-98 %] 96 % (12/09 0513) Last BM Date : 02/19/23  Intake/Output from previous day: 12/08 0701 - 12/09 0700 In: 1531.1 [P.O.:680; I.V.:251.1; IV Piggyback:600] Out: -  Intake/Output this shift: No intake/output data recorded.  PE: Gen:  Alert, NAD, pleasant and cooperative  Card:  Regular rate and rhythm Pulm:  Normal effort ORA Abd: Soft mild distention, nontender, no rebound, no guarding, no appreciable hernias on exam Skin: warm and dry, no rashes  Psych: A&Ox3   Lab Results:  Recent Labs    02/20/23 0656 02/21/23 0531  WBC 12.3* 10.8*  HGB 12.8* 13.4  HCT 38.4* 40.4  PLT 196 206   BMET Recent Labs    02/19/23 0430 02/20/23 0656  NA 135 136  K 3.3* 3.2*  CL 108 108  CO2 18* 21*  GLUCOSE 91 91  BUN 10 8  CREATININE 1.04 1.50*  CALCIUM 8.0* 8.0*   PT/INR No results for input(s): "LABPROT", "INR" in the last 72 hours. CMP     Component Value Date/Time   NA 136 02/20/2023 0656    K 3.2 (L) 02/20/2023 0656   CL 108 02/20/2023 0656   CO2 21 (L) 02/20/2023 0656   GLUCOSE 91 02/20/2023 0656   BUN 8 02/20/2023 0656   CREATININE 1.50 (H) 02/20/2023 0656   CALCIUM 8.0 (L) 02/20/2023 0656   PROT 5.5 (L) 02/20/2023 0656   ALBUMIN 2.7 (L) 02/20/2023 0656   AST 19 02/20/2023 0656   ALT 36 02/20/2023 0656   ALKPHOS 54 02/20/2023 0656   BILITOT 0.7 02/20/2023 0656   GFRNONAA >60 02/20/2023 0656   GFRAA >60 07/09/2019 1445   Lipase     Component Value Date/Time   LIPASE 99 (H) 02/19/2023 0430       Studies/Results: DG Lumbar Spine 2-3 Views  Result Date: 02/20/2023 CLINICAL DATA:  Back pain, history of kidney stones. EXAM: LUMBAR SPINE - 2-3 VIEW COMPARISON:  CT abdomen pelvis dated 02/18/2023. FINDINGS: There is no evidence of lumbar spine fracture. Alignment is normal. There is moderate degenerative disc and joint disease at L5-S1 with mild to moderate neuroforaminal stenosis at this level. No radiopaque calculi overlying the expected course of the urinary tract. 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 Electronically Signed   By: Romona Curls M.D.   On: 02/20/2023 17:43   US RENAL  Result Date: 02/20/2023  CLINICAL DATA:  Acute kidney injury. EXAM: RENAL / URINARY TRACT ULTRASOUND COMPLETE COMPARISON:  CT, 02/18/2023. FINDINGS: Right Kidney: Renal measurements: 11.6 x 5.8 x 5.6 cm = volume: 196.7 ML. Normal parenchymal echogenicity. No mass or stone. Mild dilation of the intrarenal collecting system, not evident on the recent prior CT. Left Kidney: Renal measurements: 12.3 x 6.6 x 6.6 cm = volume: 273.7 mL. Echogenicity within normal limits. No mass or hydronephrosis visualized. Bladder: Appears normal for degree of bladder distention. Other: None. IMPRESSION: 1. Mild dilation of the right intrarenal collecting system. This may be physiologic. This was not evident on the recent CT. 2.  Otherwise negative renal ultrasound. Electronically Signed   By: Amie Portland M.D.   On: 02/20/2023 16:42   DG Abd Portable 1V-Small Bowel Obstruction Protocol-initial, 8 hr delay  Result Date: 02/19/2023 CLINICAL DATA:  Re-evaluate small-bowel obstruction. EXAM: PORTABLE ABDOMEN - 1 VIEW COMPARISON:  CT from yesterday FINDINGS: 2 supine views of the abdomen and pelvis. Contrast has passed into a normal caliber colon, including to the level of the rectum. Minimal residual small bowel contrast identified. No significant small bowel dilatation. No gross free intraperitoneal air. IMPRESSION: Passage of contrast to the level of the rectum, arguing against clinically significant obstruction. Electronically Signed   By: Jeronimo Greaves M.D.   On: 02/19/2023 13:51    Anti-infectives: Anti-infectives (From admission, onward)    Start     Dose/Rate Route Frequency Ordered Stop   02/20/23 0915  ciprofloxacin (CIPRO) IVPB 400 mg        400 mg 200 mL/hr over 60 Minutes Intravenous Every 12 hours 02/20/23 0817     02/20/23 0915  metroNIDAZOLE (FLAGYL) IVPB 500 mg        500 mg 100 mL/hr over 60 Minutes Intravenous Every 12 hours 02/20/23 0817          Assessment/Plan Low suspicion for partial small bowel obstruction, favor reactive ileus due to inflammatory process. KUB 12/7 with contrast throughout the colon and no significant small bowel dilation. He has small hgb on UA, mild dilation of renal collecting system on the right, right flank pain that was increased with urination and is now resolving. I do question if he passed a small kidney stone that was not appreciable on CT Abd w/ contrast.  Advance to SOFT diet and then regular as tolerated. No acute surgical needs. Please call back if he develops increased abdominal pain or clinical  evidence of intestinal obstruction.      LOS: 2 days   I reviewed nursing notes, hospitalist notes, last 24 h vitals and pain scores, last 48 h intake and output, last  24 h labs and trends, and last 24 h imaging results.  This care required moderate level of medical decision making.   Hosie Spangle, PA-C [facility] Please see Amion for pager number during day hours 7:00am-4:30pm

## 2023-02-21 NOTE — Plan of Care (Signed)
  Problem: Pain Management: Goal: General experience of comfort will improve Outcome: Progressing   Problem: Safety: Goal: Ability to remain free from injury will improve Outcome: Progressing

## 2023-02-21 NOTE — Progress Notes (Signed)
[facility]  PROGRESS NOTE  Russell Stephens UJW:[id number] DOB: August 15, 1985 DOA: 02/18/2023 PCP: Kaleen Mask, MD   Brief HPI:   37 y.o. male with medical history significant of obesity, hernia repair surgery.  Recently admitted 12/2-12/5 for gastroenteritis, AKI, and orthostatic syncope.  Patient returns to the ED complaining of worsening abdominal pain, vomiting, and persistent diarrhea.  GI pathogen panel and C. difficile testing were negative during recent hospitalization.     Assessment/Plan:    Distal SBO/ileus -Resolved, having BMs. -General Surgery has signed off -Surgery recommended to start antibiotics for possible enteritis -Started on ciprofloxacin and Flagyl  Right lumbar pain -Patient has history of disc herniation, complains of pain in the right lower back -X-ray of lumbar spine confirmed moderate spinal stenosis L5-S1 -Continue Robaxin as needed  Acute kidney injury versus CKD stage IIIa -Creatinine elevated today at 1.83 -Patient has been taking ibuprofen intermittently for past many years for back pain -Renal ultrasound showed right intrarenal collecting system dilation -Called and discussed with urology, Dr. Liliane Shi -He recommends getting CT stone protocol; though he did not see stone on previously obtained CT abdomen/pelvis from 02/18/2023 -Follow renal function in a.m.  Elevated lipase -Improved, lipase is down to 99  Hypokalemia -Potassium is 3.3 -Replace potassium and follow BMP in am   Medications     enoxaparin (LOVENOX) injection  40 mg Subcutaneous Q24H   methocarbamol  500 mg Oral TID     Data Reviewed:   CBG:  Recent Labs  Lab 02/14/23 1414 02/15/23 0802  GLUCAP 166* 149*    SpO2: 100 %    Vitals:   02/20/23 2156 02/21/23 0513 02/21/23 0835 02/21/23 1054  BP: 128/81 127/87 (!) 133/92 (!) 133/92  Pulse: 63 63 73 73  Resp: 18 17 18 18   Temp: 98.9 F (37.2 C) 98.6 F (37 C) 98.6 F (37 C) 98.6 F (37 C)   TempSrc: Oral Oral  Oral  SpO2: 98% 96% 100%   Height:    5\' 9"  (1.753 m)      Data Reviewed:  Basic Metabolic Panel: Recent Labs  Lab 02/15/23 0350 02/16/23 0349 02/17/23 0339 02/18/23 1537 02/19/23 0430 02/20/23 0656 02/21/23 0531  NA 137   < > 138 138 135 136 137  K 3.6   < > 3.5 3.2* 3.3* 3.2* 3.3*  CL 111   < > 110 107 108 108 104  CO2 18*   < > 19* 20* 18* 21* 24  GLUCOSE 124*   < > 99 96 91 91 99  BUN 19   < > 16 13 10 8 7   CREATININE 1.31*   < > 1.16 1.23 1.04 1.50* 1.83*  CALCIUM 7.8*   < > 8.7* 8.9 8.0* 8.0* 8.2*  MG 1.2*  --   --   --  1.9  --   --    < > = values in this interval not displayed.    CBC: Recent Labs  Lab 02/15/23 0350 02/16/23 0349 02/17/23 0339 02/18/23 1537 02/19/23 0430 02/20/23 0656 02/21/23 0531  WBC 12.8*   < > 6.0 13.9* 11.1* 12.3* 10.8*  NEUTROABS 10.7*  --   --  10.2*  --   --   --   HGB 14.1   < > 13.7 15.2 13.7 12.8* 13.4  HCT 44.3   < > 43.3 46.7 41.2 38.4* 40.4  MCV 86.4   < > 86.9 85.7 83.9 84.2 84.0  PLT 166   < > 182  266 209 196 206   < > = values in this interval not displayed.    LFT Recent Labs  Lab 02/16/23 0349 02/17/23 0339 02/18/23 1537 02/20/23 0656  AST 30 12* 18 19  ALT 52* 32 34 36  ALKPHOS 54 47 62 54  BILITOT 0.6 0.4 1.0 0.7  PROT 6.4* 6.7 7.0 5.5*  ALBUMIN 3.4* 3.2* 3.6 2.7*     Antibiotics: Anti-infectives (From admission, onward)    Start     Dose/Rate Route Frequency Ordered Stop   02/20/23 0915  ciprofloxacin (CIPRO) IVPB 400 mg        400 mg 200 mL/hr over 60 Minutes Intravenous Every 12 hours 02/20/23 0817     02/20/23 0915  metroNIDAZOLE (FLAGYL) IVPB 500 mg        500 mg 100 mL/hr over 60 Minutes Intravenous Every 12 hours 02/20/23 0817          DVT prophylaxis: Lovenox  Code Status: Full code  Family Communication: No family at bedside   CONSULTS General Surgery   Subjective   Denies pain, renal ultrasound obtained yesterday showed mild dilation of right  intrarenal collecting system.   Objective    Physical Examination:   General-appears in no acute distress Heart-S1-S2, regular, no murmur auscultated Lungs-clear to auscultation bilaterally, no wheezing or crackles auscultated Abdomen-soft, mild CVA tenderness to palpation Extremities-no edema in the lower extremities Neuro-alert, oriented x3, no focal deficit noted  Status is: Inpatient:             Meredeth Ide   [facility] If 7PM-7AM, please contact night-coverage at [email or web address], Office  [phone]   02/21/2023, 11:08 AM  LOS: 2 days

## 2023-02-21 NOTE — Progress Notes (Signed)
   02/21/23 1050  Mobility  Activity Ambulated independently in hallway  Level of Assistance Independent  Assistive Device Other (Comment) (IV  Pole)  Distance Ambulated (ft) 1100 ft  Activity Response Tolerated fair  Mobility Referral Yes  Mobility visit 1 Mobility  Mobility Specialist Start Time (ACUTE ONLY) 1036  Mobility Specialist Stop Time (ACUTE ONLY) 1050  Mobility Specialist Time Calculation (min) (ACUTE ONLY) 14 min   Mobility Specialist: Progress Note  Pt agreeable to mobility session - received in bed. Required Ind using IV Pole. C/o abd pain "not as bad as before." Returned to standing in room with all needs met - call bell within reach.   Pt encouraged to ambulate on his own in the hallway.  Barnie Mort, BS Mobility Specialist Please contact via SecureChat or Rehab office at [phone].

## 2023-02-22 DIAGNOSIS — E876 Hypokalemia: Secondary | ICD-10-CM | POA: Diagnosis not present

## 2023-02-22 DIAGNOSIS — K56609 Unspecified intestinal obstruction, unspecified as to partial versus complete obstruction: Secondary | ICD-10-CM | POA: Diagnosis not present

## 2023-02-22 DIAGNOSIS — R748 Abnormal levels of other serum enzymes: Secondary | ICD-10-CM | POA: Diagnosis not present

## 2023-02-22 DIAGNOSIS — E872 Acidosis, unspecified: Secondary | ICD-10-CM | POA: Diagnosis not present

## 2023-02-22 LAB — URINALYSIS, W/ REFLEX TO CULTURE (INFECTION SUSPECTED)
Bacteria, UA: NONE SEEN
Bilirubin Urine: NEGATIVE
Glucose, UA: NEGATIVE mg/dL
Hgb urine dipstick: NEGATIVE
Ketones, ur: NEGATIVE mg/dL
Leukocytes,Ua: NEGATIVE
Nitrite: NEGATIVE
Protein, ur: NEGATIVE mg/dL
Specific Gravity, Urine: 1.012 (ref 1.005–1.030)
pH: 6 (ref 5.0–8.0)

## 2023-02-22 LAB — COMPREHENSIVE METABOLIC PANEL
ALT: 26 U/L (ref 0–44)
AST: 15 U/L (ref 15–41)
Albumin: 2.6 g/dL — ABNORMAL LOW (ref 3.5–5.0)
Alkaline Phosphatase: 50 U/L (ref 38–126)
Anion gap: 8 (ref 5–15)
BUN: 8 mg/dL (ref 6–20)
CO2: 26 mmol/L (ref 22–32)
Calcium: 8.1 mg/dL — ABNORMAL LOW (ref 8.9–10.3)
Chloride: 104 mmol/L (ref 98–111)
Creatinine, Ser: 1.75 mg/dL — ABNORMAL HIGH (ref 0.61–1.24)
GFR, Estimated: 51 mL/min — ABNORMAL LOW (ref >60)
Glucose, Bld: 95 mg/dL (ref 70–99)
Potassium: 3.5 mmol/L (ref 3.5–5.1)
Sodium: 138 mmol/L (ref 135–145)
Total Bilirubin: 0.4 mg/dL (ref <1.2)
Total Protein: 5.6 g/dL — ABNORMAL LOW (ref 6.5–8.1)

## 2023-02-22 MED ORDER — POLYETHYLENE GLYCOL 3350 17 G PO PACK
17.0000 g | PACK | Freq: Every day | ORAL | Status: DC
  Administered 2023-02-22 – 2023-02-23 (×2): 17 g via ORAL
  Filled 2023-02-22 (×3): qty 1

## 2023-02-22 MED ORDER — CIPROFLOXACIN HCL 500 MG PO TABS
500.0000 mg | ORAL_TABLET | Freq: Two times a day (BID) | ORAL | Status: DC
  Administered 2023-02-22 – 2023-02-24 (×4): 500 mg via ORAL
  Filled 2023-02-22 (×4): qty 1

## 2023-02-22 NOTE — Progress Notes (Signed)
[facility]  PROGRESS NOTE  Russell Stephens [id number]:[id number] DOB: [birth year] DOA: 02/18/2023 PCP: Kaleen Mask, MD   Brief HPI:   37 y.o. male with medical history significant of obesity, hernia repair surgery.  Recently admitted 12/2-12/5 for gastroenteritis, AKI, and orthostatic syncope.  Patient returns to the ED complaining of worsening abdominal pain, vomiting, and persistent diarrhea.  GI pathogen panel and C. difficile testing were negative during recent hospitalization.     Assessment/Plan:    Distal SBO/ileus -Resolved, having BMs. -General Surgery has signed off -Surgery recommended to start antibiotics for possible enteritis -Started on ciprofloxacin and Flagyl -Will discontinue Flagyl  Right lumbar pain -Patient has history of disc herniation, complains of pain in the right lower back -X-ray of lumbar spine confirmed moderate spinal stenosis L5-S1 -Continue Robaxin as needed  Pyelonephritis -CT of the abdomen/pelvis shows dilation of intra calyceal system and right kidney with mild perinephric stranding -Started on ciprofloxacin as above -Clinically has improved, -Urine culture obtained, follow results -If clinically improved can be switched to p.o. ciprofloxacin and discharged home in 1 to 2 days  Acute kidney injury versus CKD stage IIIa -Creatinine elevated today at 1.75 -Patient has been taking ibuprofen intermittently for past many years for back pain -Renal ultrasound showed right intrarenal collecting system dilation -Called and discussed with urology, Dr. Liliane Shi -He recommends getting CT stone protocol; though he did not see stone on previously obtained CT abdomen/pelvis from 02/18/2023 -CT stone protocol obtained today shows mild perinephric stranding of right kidney and dilation of intra calyceal system -Nephrology consulted for elevated creatinine  Elevated lipase -Improved, lipase is down to 99  Hypokalemia -Replete   Medications      enoxaparin (LOVENOX) injection  40 mg Subcutaneous Q24H   methocarbamol  500 mg Oral TID     Data Reviewed:   CBG:  No results for input(s): "GLUCAP" in the last 168 hours.   SpO2: 98 %    Vitals:   02/21/23 1556 02/21/23 2008 02/22/23 0406 02/22/23 0740  BP: (!) 137/93 (!) 125/97 117/81 (!) 145/97  Pulse: 74 70 63 60  Resp: 18 18 18 17   Temp: 98.6 F (37 C) 98.6 F (37 C) 98.8 F (37.1 C) 98 F (36.7 C)  TempSrc:  Oral Oral Oral  SpO2: 98% 96% 96% 98%  Weight:      Height:          Data Reviewed:  Basic Metabolic Panel: Recent Labs  Lab 02/18/23 1537 02/19/23 0430 02/20/23 0656 02/21/23 0531 02/22/23 0619  NA 138 135 136 137 138  K 3.2* 3.3* 3.2* 3.3* 3.5  CL 107 108 108 104 104  CO2 20* 18* 21* 24 26  GLUCOSE 96 91 91 99 95  BUN 13 10 8 7 8   CREATININE 1.23 1.04 1.50* 1.83* 1.75*  CALCIUM 8.9 8.0* 8.0* 8.2* 8.1*  MG  --  1.9  --   --   --     CBC: Recent Labs  Lab 02/17/23 0339 02/18/23 1537 02/19/23 0430 02/20/23 0656 02/21/23 0531  WBC 6.0 13.9* 11.1* 12.3* 10.8*  NEUTROABS  --  10.2*  --   --   --   HGB 13.7 15.2 13.7 12.8* 13.4  HCT 43.3 46.7 41.2 38.4* 40.4  MCV 86.9 85.7 83.9 84.2 84.0  PLT 182 266 209 196 206    LFT Recent Labs  Lab 02/16/23 0349 02/17/23 0339 02/18/23 1537 02/20/23 0656 02/22/23 0619  AST 30 12* 18 19 15  ALT 52* 32 34 36 26  ALKPHOS 54 47 62 54 50  BILITOT 0.6 0.4 1.0 0.7 0.4  PROT 6.4* 6.7 7.0 5.5* 5.6*  ALBUMIN 3.4* 3.2* 3.6 2.7* 2.6*     Antibiotics: Anti-infectives (From admission, onward)    Start     Dose/Rate Route Frequency Ordered Stop   02/20/23 0915  ciprofloxacin (CIPRO) IVPB 400 mg        400 mg 200 mL/hr over 60 Minutes Intravenous Every 12 hours 02/20/23 0817     02/20/23 0915  metroNIDAZOLE (FLAGYL) IVPB 500 mg        500 mg 100 mL/hr over 60 Minutes Intravenous Every 12 hours 02/20/23 0817          DVT prophylaxis: Lovenox  Code Status: Full code  Family Communication:  No family at bedside   CONSULTS General Surgery   Subjective    Flank pain has improved.  Objective    Physical Examination:   General-appears in no acute distress Heart-S1-S2, regular, no murmur auscultated Lungs-clear to auscultation bilaterally, no wheezing or crackles auscultated Abdomen-soft, nontender, no organomegaly Extremities-no edema in the lower extremities Neuro-alert, oriented x3, no focal deficit noted  Status is: Inpatient:             Meredeth Ide   [facility] If 7PM-7AM, please contact night-coverage at [email or web address], Office  [phone]   02/22/2023, 10:58 AM  LOS: 3 days

## 2023-02-22 NOTE — Plan of Care (Signed)
  Problem: Education: Goal: Knowledge of General Education information will improve Description: Including pain rating scale, medication(s)/side effects and non-pharmacologic comfort measures Outcome: Progressing   Problem: Health Behavior/Discharge Planning: Goal: Ability to manage health-related needs will improve Outcome: Progressing   Problem: Clinical Measurements: Goal: Ability to maintain clinical measurements within normal limits will improve Outcome: Progressing Goal: Will remain free from infection Outcome: Progressing Goal: Diagnostic test results will improve Outcome: Progressing Goal: Respiratory complications will improve Outcome: Progressing Goal: Cardiovascular complication will be avoided Outcome: Progressing   Problem: Activity: Goal: Risk for activity intolerance will decrease Outcome: Progressing   Problem: Nutrition: Goal: Adequate nutrition will be maintained Outcome: Progressing   Problem: Coping: Goal: Level of anxiety will decrease Outcome: Progressing   Problem: Elimination: Goal: Will not experience complications related to bowel motility Outcome: Progressing Goal: Will not experience complications related to urinary retention Outcome: Progressing   Problem: Pain Management: Goal: General experience of comfort will improve Outcome: Not Progressing   Problem: Safety: Goal: Ability to remain free from injury will improve Outcome: Progressing   Problem: Skin Integrity: Goal: Risk for impaired skin integrity will decrease Outcome: Progressing

## 2023-02-22 NOTE — Progress Notes (Signed)
   02/22/23 1517  TOC Brief Assessment  Insurance and Status Reviewed  Patient has primary care physician Yes  Home environment has been reviewed spouse  Prior level of function: independent  Prior/Current Home Services No current home services  Social Determinants of Health Reivew SDOH reviewed no interventions necessary  Readmission risk has been reviewed Yes  Transition of care needs no transition of care needs at this time     Transition of Care Department [facility]) has reviewed patient and no TOC needs have been identified at this time. We will continue to monitor patient advancement through interdisciplinary progression rounds. If new patient transition needs arise, please place a TOC consult.

## 2023-02-23 DIAGNOSIS — E876 Hypokalemia: Secondary | ICD-10-CM | POA: Diagnosis not present

## 2023-02-23 DIAGNOSIS — K56609 Unspecified intestinal obstruction, unspecified as to partial versus complete obstruction: Secondary | ICD-10-CM | POA: Diagnosis not present

## 2023-02-23 DIAGNOSIS — D72829 Elevated white blood cell count, unspecified: Secondary | ICD-10-CM

## 2023-02-23 LAB — CBC
HCT: 41.8 % (ref 39.0–52.0)
Hemoglobin: 14.1 g/dL (ref 13.0–17.0)
MCH: 28 pg (ref 26.0–34.0)
MCHC: 33.7 g/dL (ref 30.0–36.0)
MCV: 83.1 fL (ref 80.0–100.0)
Platelets: 265 10*3/uL (ref 150–400)
RBC: 5.03 MIL/uL (ref 4.22–5.81)
RDW: 13.5 % (ref 11.5–15.5)
WBC: 13.4 10*3/uL — ABNORMAL HIGH (ref 4.0–10.5)
nRBC: 0 % (ref 0.0–0.2)

## 2023-02-23 LAB — RENAL FUNCTION PANEL
Albumin: 2.8 g/dL — ABNORMAL LOW (ref 3.5–5.0)
Anion gap: 7 (ref 5–15)
BUN: 9 mg/dL (ref 6–20)
CO2: 28 mmol/L (ref 22–32)
Calcium: 8.4 mg/dL — ABNORMAL LOW (ref 8.9–10.3)
Chloride: 101 mmol/L (ref 98–111)
Creatinine, Ser: 1.64 mg/dL — ABNORMAL HIGH (ref 0.61–1.24)
GFR, Estimated: 55 mL/min — ABNORMAL LOW (ref >60)
Glucose, Bld: 97 mg/dL (ref 70–99)
Phosphorus: 3.3 mg/dL (ref 2.5–4.6)
Potassium: 3.6 mmol/L (ref 3.5–5.1)
Sodium: 136 mmol/L (ref 135–145)

## 2023-02-23 MED ORDER — HYDROMORPHONE HCL 1 MG/ML IJ SOLN
1.0000 mg | INTRAMUSCULAR | Status: DC | PRN
  Administered 2023-02-23: 1 mg via INTRAVENOUS
  Filled 2023-02-23: qty 1

## 2023-02-23 MED ORDER — OXYCODONE-ACETAMINOPHEN 5-325 MG PO TABS
1.0000 | ORAL_TABLET | ORAL | Status: DC | PRN
  Administered 2023-02-23: 2 via ORAL
  Filled 2023-02-23: qty 2

## 2023-02-23 MED ORDER — SODIUM CHLORIDE 0.9 % IV SOLN: INTRAVENOUS | Status: AC

## 2023-02-23 NOTE — Progress Notes (Signed)
Mobility Specialist: Progress Note   02/23/23 1613  Mobility  Activity Ambulated independently in hallway  Level of Assistance Independent  Assistive Device None  Distance Ambulated (ft) 550 ft  Activity Response Tolerated well  Mobility Referral Yes  Mobility visit 1 Mobility  Mobility Specialist Start Time (ACUTE ONLY) 1057  Mobility Specialist Stop Time (ACUTE ONLY) 1104  Mobility Specialist Time Calculation (min) (ACUTE ONLY) 7 min    Pt was agreeable to mobility session - received in bed. Had c/o abdominal pain but tolerable at the time. Ind throughout. Returned to room without fault. Left in bed with all needs met, call bell in reach.   Maurene Capes Mobility Specialist Please contact via SecureChat or Rehab office at [phone]

## 2023-02-23 NOTE — Consult Note (Addendum)
Urology Consult Note   Requesting Attending Physician:  Kathlen Mody, MD Service Providing Consult: Urology  Consulting Attending: Dr. Laverle Patter   Reason for Consult:  assess for papillary necrosis/analgesic nephropathy  HPI: Russell Stephens is seen in consultation for reasons noted above at the request of Kathlen Mody, MD. On my arrival patient is alert, oriented, and in no distress.  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  He attempts to make healthy choices where he can and remains well-hydrated.  PMH significant for hernia repair surgery.  Initially patient was admitted from 12/2-12/5 for gastroenteritis, AKI, and what sounds to be an orthostatic syncopal event.  He reports being out of the hospital for approximately 1 day and then returning with abdominal pain, N/V, and diarrhea.    He denies any significant urologic history and has never passed a kidney stone, though states his mother has had several.  His pain was primarily in the flank radiating around to the RLQ, and eventually testicles.  His serum creatinine was elevated to 1.83 on this admission, though this has continued to improve daily.  He remains normothermic with stable blood pressure.  His urinalysis on arrival was reflective of dehydration but not concerning for infection.  He states that he occasionally takes ibuprofen for pain though does not exceed 600 mg a day, well below the recommended daily dosing.   ------------------  Assessment:  37 y.o. male with right-sided flank pain   Recommendations: # Right-sided flank pain # Prominence of right ureteral lumen  Etiology of the patient's symptoms is unclear.  There is not a visible obstruction or associated hydronephrosis on CT A/P.  There is some prominence of the right ureteral lumen, possibly reflective of a resolving process, but not obstructive. Nor is his urine infected.   Patient does not report enough acetaminophen or NSAID  consumption to be responsible for papillary necrosis, nor would it be likely to only affect the right side.   Patient questions whether or not he recently had a kidney stone.  Recent stone passage may look similar to his CT imaging, but there would most likely be some component of hydronephrosis/hydroureter.  Additionally the pain would resolve as the stone passed into his bladder, where as his is ongoing, and has intermittently worsened with the addition of IV fluids.  There is not imaging on CT A/P that is consistent with papillary necrosis.  Will recommend following up with a repeat scan in 2-3 weeks to review fullness of the renal pelvis and thickening of the ureteral lumen.  Will consider cystoscopy, retrograde pyelogram, and ureteroscopy if patient worsens acutely or there is no improvement on CT imaging.  This  Agree with overnight rehydration  I will check on him again tomorrow.  Case and plan discussed with Dr. Laverle Patter  Past Medical History: History reviewed. No pertinent past medical history.  Past Surgical History:  Past Surgical History:  Procedure Laterality Date   HERNIA REPAIR      Medication: Current Facility-Administered Medications  Medication Dose Route Frequency Provider Last Rate Last Admin   0.9 %  sodium chloride infusion   Intravenous Continuous Kathlen Mody, MD 100 mL/hr at 02/23/23 0830 New Bag at 02/23/23 0830   ciprofloxacin (CIPRO) tablet 500 mg  500 mg Oral BID Meredeth Ide, MD   500 mg at 02/23/23 0827   enoxaparin (LOVENOX) injection 40 mg  40 mg Subcutaneous Q24H Meredeth Ide, MD  40 mg at 02/23/23 0946   HYDROmorphone (DILAUDID) injection 1 mg  1 mg Intravenous Q3H PRN Kathlen Mody, MD   1 mg at 02/23/23 1159   methocarbamol (ROBAXIN) tablet 500 mg  500 mg Oral TID Meredeth Ide, MD   500 mg at 02/23/23 0946   naloxone (NARCAN) injection 0.4 mg  0.4 mg Intravenous PRN John Giovanni, MD       ondansetron [facility]) injection 4 mg  4 mg Intravenous  Q6H PRN John Giovanni, MD   4 mg at 02/23/23 0421   oxyCODONE-acetaminophen (PERCOCET/ROXICET) 5-325 MG per tablet 1-2 tablet  1-2 tablet Oral Q4H PRN Kathlen Mody, MD       polyethylene glycol (MIRALAX / GLYCOLAX) packet 17 g  17 g Oral Daily Meredeth Ide, MD   17 g at 02/23/23 0946    Allergies: Allergies  Allergen Reactions   Dairy Aid [Tilactase] Other (See Comments)    GI intolerance    Social History: Social History   Tobacco Use   Smoking status: Never   Smokeless tobacco: Never  Substance Use Topics   Alcohol use: Yes   Drug use: Not Currently    Family History History reviewed. No pertinent family history.  Review of Systems  Genitourinary:  Positive for flank pain.     Objective   Vital signs in last 24 hours: BP (!) 157/102 (BP Location: Left Arm)   Pulse 69   Temp 98.3 F (36.8 C) (Oral)   Resp 17   Ht 5\' 9"  (1.753 m)   Wt 119.7 kg   SpO2 95%   BMI 38.97 kg/m   Physical Exam General: NAD, A&O, resting, appropriate HEENT: [place]/AT Pulmonary: Normal work of breathing Cardiovascular: RRR, no cyanosis Abdomen: Soft, NTTP, nondistended   Most Recent Labs: Lab Results  Component Value Date   WBC 13.4 (H) 02/23/2023   HGB 14.1 02/23/2023   HCT 41.8 02/23/2023   PLT 265 02/23/2023    Lab Results  Component Value Date   NA 136 02/23/2023   K 3.6 02/23/2023   CL 101 02/23/2023   CO2 28 02/23/2023   BUN 9 02/23/2023   CREATININE 1.64 (H) 02/23/2023   CALCIUM 8.4 (L) 02/23/2023   MG 1.9 02/19/2023   PHOS 3.3 02/23/2023    No results found for: "INR", "APTT"   Urine Culture: @[id number] (laburin,org,[id number])@   IMAGING: CT RENAL STONE STUDY  Result Date: 02/21/2023 CLINICAL DATA:  Abdominal/flank pain EXAM: CT ABDOMEN AND PELVIS WITHOUT CONTRAST TECHNIQUE: Multidetector CT imaging of the abdomen and pelvis was performed following the standard protocol without IV contrast. RADIATION DOSE REDUCTION: This exam was performed  according to the departmental dose-optimization program which includes automated exposure control, adjustment of the mA and/or kV according to patient size and/or use of iterative reconstruction technique. COMPARISON:  02/18/2023 and 02/14/2023 FINDINGS: Lower chest: Lung bases are clear. Hepatobiliary: Unenhanced liver is unremarkable. Gallbladder is unremarkable. No intrahepatic or extrahepatic ductal dilatation. Pancreas: Within normal limits. Spleen: Within normal limits. Adrenals/Urinary Tract: Adrenal glands are within normal limits. Kidneys are within normal limits. Mild right hydronephrosis, with associated mild periureteral stranding along the proximal/mid right ureter (series 3/image 60). No ureteral or bladder calculi. This appearance raises concern for ascending infection, although pyelonephritis was not evident on recent enhanced CT. Bladder is underdistended but unremarkable. Stomach/Bowel: Stomach is notable for a small hiatal hernia. No evidence of bowel obstruction. Normal appendix (series 3/image 55). No colonic wall thickening or inflammatory changes. Vascular/Lymphatic: No evidence of  abdominal aortic aneurysm. No suspicious abdominopelvic lymphadenopathy. Reproductive: Prostate is unremarkable. Other: No abdominopelvic ascites. Mild fat in the left inguinal canal. Musculoskeletal: Visualized osseous structures are within normal limits. IMPRESSION: Mild right hydronephrosis, with associated mild periureteral stranding along the proximal/mid right ureter, new. Ascending infection is possible. Correlate with urinalysis. Otherwise negative CT abdomen/pelvis. Electronically Signed   By: Charline Bills M.D.   On: 02/21/2023 19:07    ------  Elmon Kirschner, NP Pager: [phone]   Please contact the urology consult pager with any further questions/concerns.

## 2023-02-23 NOTE — Consult Note (Signed)
Nephrology Consult   Requesting provider: Mauro Kaufmann Service requesting consult: Hospitalist Reason for consult: AKI   Assessment/Recommendations: Russell Stephens is a/an 37 y.o. male with a past medical history hernia repair who present w/ SBO c/b AKI and back pain   Non-Oliguric AKI: Likely associated with dehydration and contrast associated kidney injury with imaging.  Creatinine is improving.   -Given creatinine is improving we will sign off at this time -Patient is to to have labs in 1 to 2 weeks after discharge can refer to nephrology if needed -Continue to monitor daily Cr, Dose meds for GFR -Monitor Daily I/Os, Daily weight  -Maintain MAP>65 for optimal renal perfusion.  -Avoid nephrotoxic medications including NSAIDs -Use synthetic opioids (Fentanyl/Dilaudid) if needed  Right back pain/mild hydronephrosis: Represented on imaging.  Treated for pyelonephritis but I do not think he has infection.  Unclear what is causing his severe back pain.  No stone present.  Urine is completely clear with no blood at all.  I do not think his back pain is urological in origin although I mention his papillary necrosis as a possibility to urology.  I will see him but unlikely anything to do at this point  SBO: Seems improved.  Management per primary  Leukocytosis: Management per primary team.  Given the patient's creatinine is improving we will sign off   Recommendations conveyed to primary service.    Darnell Level [facility] 02/23/2023 11:33 AM   _____________________________________________________________________________________ CC: N/V  History of Present Illness: Russell Stephens is a/an 37 y.o. male with a past medical history of hernia s/p repair who presents with nausea, vomiting, and back pain.  Patient was admitted to the hospital from 12/2 until 12/5 for gastroenteritis, AKI, orthostatic syncope.  He was hydrated and his creatinine improved from 1.9 down to 1.   He returned to the hospital on 12/6 because of worsening abdominal pain as well as vomiting and diarrhea.  Labs demonstrated a creatinine of 1.2 at that time.  Urinalysis was reassuring without signs of infection.  CT scan did demonstrate some concerns for small bowel obstruction.  General surgery was consulted and continue to monitor his symptoms without intervention.  The patient denies fevers, chills, shortness of breath, chest pain.  He received a CT abdomen pelvis with contrast on 12/6 as well as 12/2.  Creatinine increased from 1.2 on arrival up to 1.8 on 02/21/2023 and has since improved down to 1.6.  Patient states he has had significant back pain on the right side.  Renal ultrasound on 12/8 demonstrated mild dilation of the right intrarenal collecting system.  CT scan was obtained on 12/9 which demonstrated mild right hydronephrosis with periureteral stranding.  He was started on antibiotics despite normal urinalysis.  The patient states today his back pain has persisted.  He was taking some NSAIDs at home.  Unclear how much.  No episodes of hematuria. Also has some dysuria.   Medications:  Current Facility-Administered Medications  Medication Dose Route Frequency Provider Last Rate Last Admin   0.9 %  sodium chloride infusion   Intravenous Continuous Kathlen Mody, MD 100 mL/hr at 02/23/23 0830 New Bag at 02/23/23 0830   ciprofloxacin (CIPRO) tablet 500 mg  500 mg Oral BID Meredeth Ide, MD   500 mg at 02/23/23 0827   enoxaparin (LOVENOX) injection 40 mg  40 mg Subcutaneous Q24H Meredeth Ide, MD   40 mg at 02/23/23 0946   HYDROmorphone (DILAUDID) injection 1 mg  1  mg Intravenous Q3H PRN Kathlen Mody, MD       methocarbamol (ROBAXIN) tablet 500 mg  500 mg Oral TID Meredeth Ide, MD   500 mg at 02/23/23 0946   naloxone (NARCAN) injection 0.4 mg  0.4 mg Intravenous PRN John Giovanni, MD       ondansetron [facility]) injection 4 mg  4 mg Intravenous Q6H PRN John Giovanni, MD   4 mg at  02/23/23 0421   oxyCODONE-acetaminophen (PERCOCET/ROXICET) 5-325 MG per tablet 1-2 tablet  1-2 tablet Oral Q4H PRN Kathlen Mody, MD       polyethylene glycol (MIRALAX / GLYCOLAX) packet 17 g  17 g Oral Daily Meredeth Ide, MD   17 g at 02/23/23 1324     ALLERGIES Dairy aid [tilactase]  MEDICAL HISTORY History reviewed. No pertinent past medical history.   SOCIAL HISTORY Social History   Socioeconomic History   Marital status: Married    Spouse name: Not on file   Number of children: Not on file   Years of education: Not on file   Highest education level: Not on file  Occupational History   Not on file  Tobacco Use   Smoking status: Never   Smokeless tobacco: Never  Substance and Sexual Activity   Alcohol use: Yes   Drug use: Not Currently   Sexual activity: Yes  Other Topics Concern   Not on file  Social History Narrative   Not on file   Social Determinants of Health   Financial Resource Strain: Not on file  Food Insecurity: No Food Insecurity (02/19/2023)   Hunger Vital Sign    Worried About Running Out of Food in the Last Year: Never true    Ran Out of Food in the Last Year: Never true  Transportation Needs: No Transportation Needs (02/19/2023)   PRAPARE - Administrator, Civil Service (Medical): No    Lack of Transportation (Non-Medical): No  Physical Activity: Not on file  Stress: Not on file  Social Connections: Not on file  Intimate Partner Violence: Not At Risk (02/19/2023)   Humiliation, Afraid, Rape, and Kick questionnaire    Fear of Current or Ex-Partner: No    Emotionally Abused: No    Physically Abused: No    Sexually Abused: No     FAMILY HISTORY History reviewed. No pertinent family history.  History of HTN in father   Review of Systems: 12 systems reviewed Otherwise as per HPI, all other systems reviewed and negative  Physical Exam: Vitals:   02/23/23 0418 02/23/23 0739  BP: (!) 151/100 (!) 157/102  Pulse: 60 69  Resp: 20 17   Temp: 98.2 F (36.8 C) 98.3 F (36.8 C)  SpO2: 98% 95%   Total I/O In: 720 [P.O.:720] Out: -   Intake/Output Summary (Last 24 hours) at 02/23/2023 1133 Last data filed at 02/23/2023 1100 Gross per 24 hour  Intake 1593.11 ml  Output --  Net 1593.11 ml   General: well-appearing, no acute distress HEENT: anicteric sclera, oropharynx clear without lesions CV: normal rate, no edema Lungs: clear to auscultation bilaterally, normal work of breathing Abd: soft, non-tender, non-distended Skin: no visible lesions or rashes Psych: alert, engaged, appropriate mood and affect Musculoskeletal: no obvious deformities Neuro: normal speech, no gross focal deficits   Test Results Reviewed Lab Results  Component Value Date   NA 136 02/23/2023   K 3.6 02/23/2023   CL 101 02/23/2023   CO2 28 02/23/2023   BUN 9 02/23/2023  CREATININE 1.64 (H) 02/23/2023   CALCIUM 8.4 (L) 02/23/2023   ALBUMIN 2.8 (L) 02/23/2023   PHOS 3.3 02/23/2023    CBC Recent Labs  Lab 02/18/23 1537 02/19/23 0430 02/20/23 0656 02/21/23 0531 02/23/23 0734  WBC 13.9*   < > 12.3* 10.8* 13.4*  NEUTROABS 10.2*  --   --   --   --   HGB 15.2   < > 12.8* 13.4 14.1  HCT 46.7   < > 38.4* 40.4 41.8  MCV 85.7   < > 84.2 84.0 83.1  PLT 266   < > 196 206 265   < > = values in this interval not displayed.    I have reviewed all relevant outside healthcare records related to the patient's current hospitalization

## 2023-02-23 NOTE — Progress Notes (Signed)
[facility]                                                                               Russell Stephens, is a 37 y.o. male, DOB - Mar 02, 1986, UVO:[id number] Admit date - 02/18/2023    Outpatient Primary MD for the patient is Kaleen Mask, MD  LOS - 4  days    Brief summary    37 y.o. male with medical history significant of obesity, hernia repair surgery.  Recently admitted 12/2-12/5 for gastroenteritis, AKI, and orthostatic syncope.  Patient returns to the ED complaining of worsening abdominal pain, vomiting, and persistent diarrhea.  GI pathogen panel and C. difficile testing were negative during recent hospitalization.    Assessment & Plan    Assessment and Plan:   Distal SBO/ Ileus:  Resolved.     Right sided flank pain  With CT abd and pelvis showing dilation of intra calyceal system and right kidney with mild perinephric stranding and mild hydronephrosis .  He probably passed a renal stone on 12/8. Urology and nephrology on board and appreciate recommendations.     Possible Pyelonephritis:  CT abdomen and pelvis shows dilation of intra calyceal system and right kidney with mild perinephric stranding . Currently on ciprofloxacin.     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  Recheck renal parameters in am.   Body mass index is 38.97 kg/m. Obesity    Leukocytosis From pyelonephritis.       Estimated body mass index is 38.97 kg/m as calculated from the following:   Height as of this encounter: 5\' 9"  (1.753 m).   Weight as of this encounter: 119.7 kg.  Code Status: full code.  DVT Prophylaxis:  enoxaparin (LOVENOX) injection 40 mg Start: 02/20/23 1730 SCDs Start: 02/19/23 0224   Level of Care: Level of care: Telemetry Medical Family Communication: none at bedside.  Disposition Plan:     Remains inpatient appropriate:  pain control.   Procedures:  CT renal stone study.  Consultants:   Urology Nephrology.    Antimicrobials:   Anti-infectives (From admission, onward)    Start     Dose/Rate Route Frequency Ordered Stop   02/22/23 2000  ciprofloxacin (CIPRO) tablet 500 mg        500 mg Oral 2 times daily 02/22/23 1226     02/20/23 0915  ciprofloxacin (CIPRO) IVPB 400 mg  Status:  Discontinued        400 mg 200 mL/hr over 60 Minutes Intravenous Every 12 hours 02/20/23 0817 02/22/23 1226   02/20/23 0915  metroNIDAZOLE (FLAGYL) IVPB 500 mg  Status:  Discontinued        500 mg 100 mL/hr over 60 Minutes Intravenous Every 12 hours 02/20/23 0817 02/22/23 1226        Medications  Scheduled Meds:  ciprofloxacin  500 mg Oral BID   enoxaparin (LOVENOX) injection  40 mg Subcutaneous Q24H   methocarbamol  500 mg Oral TID   polyethylene glycol  17 g Oral Daily   Continuous Infusions:  sodium chloride 100 mL/hr at 02/23/23 0830   PRN Meds:.HYDROmorphone (DILAUDID) injection, naLOXone (NARCAN)  injection, ondansetron, oxyCODONE-acetaminophen  Subjective:   Russell Stephens was seen and examined today.  Reports intermittent right sided with back pain.    Objective:   Vitals:   02/22/23 2044 02/23/23 0418 02/23/23 0739 02/23/23 1458  BP: (!) 149/99 (!) 151/100 (!) 157/102 (!) 128/93  Pulse: 62 60 69 64  Resp: 18 20 17 17   Temp: 98.3 F (36.8 C) 98.2 F (36.8 C) 98.3 F (36.8 C) 98.4 F (36.9 C)  TempSrc: Oral Oral Oral Oral  SpO2: 99% 98% 95% 97%  Weight:      Height:        Intake/Output Summary (Last 24 hours) at 02/23/2023 1634 Last data filed at 02/23/2023 1100 Gross per 24 hour  Intake 940 ml  Output --  Net 940 ml   Filed Weights   02/21/23 1100  Weight: 119.7 kg     Exam General: Alert and oriented x 3, NAD Cardiovascular: S1 S2 auscultated, no murmurs, RRR Respiratory: Clear to auscultation bilaterally, no wheezing, rales or rhonchi Gastrointestinal: Soft, nontender, nondistended, + bowel sounds, right flank pain.  Ext: no pedal edema bilaterally Neuro:  AAOx3, Cr N's II- XII. Strength 5/5 upper and lower extremities bilaterally Skin: No rashes Psych: Normal affect and demeanor, alert and oriented x3    Data Reviewed:  I have personally reviewed following labs and imaging studies   CBC Lab Results  Component Value Date   WBC 13.4 (H) 02/23/2023   RBC 5.03 02/23/2023   HGB 14.1 02/23/2023   HCT 41.8 02/23/2023   MCV 83.1 02/23/2023   MCH 28.0 02/23/2023   PLT 265 02/23/2023   MCHC 33.7 02/23/2023   RDW 13.5 02/23/2023   LYMPHSABS 1.7 02/18/2023   MONOABS 1.6 (H) 02/18/2023   EOSABS 0.2 02/18/2023   BASOSABS 0.1 02/18/2023     Last metabolic panel Lab Results  Component Value Date   NA 136 02/23/2023   K 3.6 02/23/2023   CL 101 02/23/2023   CO2 28 02/23/2023   BUN 9 02/23/2023   CREATININE 1.64 (H) 02/23/2023   GLUCOSE 97 02/23/2023   GFRNONAA 55 (L) 02/23/2023   GFRAA >60 07/09/2019   CALCIUM 8.4 (L) 02/23/2023   PHOS 3.3 02/23/2023   PROT 5.6 (L) 02/22/2023   ALBUMIN 2.8 (L) 02/23/2023   BILITOT 0.4 02/22/2023   ALKPHOS 50 02/22/2023   AST 15 02/22/2023   ALT 26 02/22/2023   ANIONGAP 7 02/23/2023    CBG (last 3)  No results for input(s): "GLUCAP" in the last 72 hours.    Coagulation Profile: No results for input(s): "INR", "PROTIME" in the last 168 hours.   Radiology Studies: No results found.     Kathlen Mody M.D. [facility] 02/23/2023, 4:34 PM  Available via Epic secure chat 7am-7pm After 7 pm, please refer to night coverage provider listed on amion.

## 2023-02-24 DIAGNOSIS — K56609 Unspecified intestinal obstruction, unspecified as to partial versus complete obstruction: Secondary | ICD-10-CM | POA: Diagnosis not present

## 2023-02-24 DIAGNOSIS — D72829 Elevated white blood cell count, unspecified: Secondary | ICD-10-CM | POA: Diagnosis not present

## 2023-02-24 DIAGNOSIS — E872 Acidosis, unspecified: Secondary | ICD-10-CM | POA: Diagnosis not present

## 2023-02-24 DIAGNOSIS — E876 Hypokalemia: Secondary | ICD-10-CM | POA: Diagnosis not present

## 2023-02-24 LAB — CBC WITH DIFFERENTIAL/PLATELET
Abs Immature Granulocytes: 0.25 10*3/uL — ABNORMAL HIGH (ref 0.00–0.07)
Basophils Absolute: 0.1 10*3/uL (ref 0.0–0.1)
Basophils Relative: 1 %
Eosinophils Absolute: 0.2 10*3/uL (ref 0.0–0.5)
Eosinophils Relative: 2 %
HCT: 38.7 % — ABNORMAL LOW (ref 39.0–52.0)
Hemoglobin: 12.9 g/dL — ABNORMAL LOW (ref 13.0–17.0)
Immature Granulocytes: 2 %
Lymphocytes Relative: 24 %
Lymphs Abs: 2.8 10*3/uL (ref 0.7–4.0)
MCH: 27.7 pg (ref 26.0–34.0)
MCHC: 33.3 g/dL (ref 30.0–36.0)
MCV: 83.2 fL (ref 80.0–100.0)
Monocytes Absolute: 1.2 10*3/uL — ABNORMAL HIGH (ref 0.1–1.0)
Monocytes Relative: 11 %
Neutro Abs: 7.1 10*3/uL (ref 1.7–7.7)
Neutrophils Relative %: 60 %
Platelets: 258 10*3/uL (ref 150–400)
RBC: 4.65 MIL/uL (ref 4.22–5.81)
RDW: 13.6 % (ref 11.5–15.5)
WBC: 11.7 10*3/uL — ABNORMAL HIGH (ref 4.0–10.5)
nRBC: 0 % (ref 0.0–0.2)

## 2023-02-24 LAB — RENAL FUNCTION PANEL
Albumin: 2.6 g/dL — ABNORMAL LOW (ref 3.5–5.0)
Anion gap: 8 (ref 5–15)
BUN: 10 mg/dL (ref 6–20)
CO2: 26 mmol/L (ref 22–32)
Calcium: 7.7 mg/dL — ABNORMAL LOW (ref 8.9–10.3)
Chloride: 103 mmol/L (ref 98–111)
Creatinine, Ser: 1.44 mg/dL — ABNORMAL HIGH (ref 0.61–1.24)
GFR, Estimated: >60 mL/min (ref >60)
Glucose, Bld: 88 mg/dL (ref 70–99)
Phosphorus: 3.2 mg/dL (ref 2.5–4.6)
Potassium: 3.3 mmol/L — ABNORMAL LOW (ref 3.5–5.1)
Sodium: 137 mmol/L (ref 135–145)

## 2023-02-24 MED ORDER — POTASSIUM CHLORIDE CRYS ER 20 MEQ PO TBCR
40.0000 meq | EXTENDED_RELEASE_TABLET | Freq: Once | ORAL | Status: AC
  Administered 2023-02-24: 40 meq via ORAL
  Filled 2023-02-24: qty 2

## 2023-02-24 MED ORDER — CIPROFLOXACIN HCL 500 MG PO TABS: 500.0000 mg | ORAL_TABLET | Freq: Two times a day (BID) | ORAL | 0 refills | Status: AC

## 2023-02-24 NOTE — Progress Notes (Signed)
     Subjective: No acute events overnight.  Patient's pain completely resolved.  He reports passing tea colored urine shortly after initially consulting on him, a "weird sensation" been his pain resolved.  Objective: Vital signs in last 24 hours: Temp:  [98.2 F (36.8 C)-98.8 F (37.1 C)] 98.2 F (36.8 C) (12/12 0743) Pulse Rate:  [51-69] 63 (12/12 0743) Resp:  [16-18] 18 (12/12 0743) BP: (127-137)/(82-94) 132/82 (12/12 0743) SpO2:  [97 %-99 %] 97 % (12/12 0743)  Assessment/Plan: # Right-sided flank pain # Prominence of right ureteral lumen # AKI  Continue to trend labs.  Patient has had incremental improvement in serum creatinine on a daily basis.  I have encouraged him to refrain from any nephrotoxic medications, particularly NSAIDs until he has a lab confirmed resolution of his AKI.  Plan to follow-up with Dr. Laverle Patter and have repeat CT collected in a few weeks  Once medically cleared, okay to discharge from a urologic perspective.  Urology will sign off at this time.  Please feel free to call with questions or concerns  Intake/Output from previous day: 12/11 0701 - 12/12 0700 In: 720 [P.O.:720] Out: -   Intake/Output this shift: No intake/output data recorded.  Physical Exam:  General: Alert and oriented CV: No cyanosis Lungs: equal chest rise Abdomen: Soft, NTND, no rebound or guarding   Lab Results: Recent Labs    02/23/23 0734 02/24/23 0537  HGB 14.1 12.9*  HCT 41.8 38.7*   BMET Recent Labs    02/23/23 0734 02/24/23 0538  NA 136 137  K 3.6 3.3*  CL 101 103  CO2 28 26  GLUCOSE 97 88  BUN 9 10  CREATININE 1.64* 1.44*  CALCIUM 8.4* 7.7*     Studies/Results: No results found.    LOS: 5 days   Elmon Kirschner, NP [facility] Pager: [phone]  02/24/2023, 10:59 AM

## 2023-02-24 NOTE — Plan of Care (Signed)

## 2023-02-27 NOTE — Discharge Summary (Signed)
Physician Discharge Summary   Patient: Russell Stephens MRN: [id number] DOB: 09/05/1985  Admit date:     02/18/2023  Discharge date: 02/24/2023  Discharge Physician: Kathlen Mody   PCP: Kaleen Mask, MD   Recommendations at discharge:  PLEASE FOLLOW UP WITH PCP FOR BMP IN ONE WEEK.  Please follow up with general surgery as needed.   Discharge Diagnoses: Principal Problem:   SBO (small bowel obstruction) (HCC) Active Problems:   Hypokalemia   Leukocytosis   Normal anion gap metabolic acidosis   Elevated lipase  Hospital Course:   37 y.o. male with medical history significant of obesity, hernia repair surgery.  Recently admitted 12/2-12/5 for gastroenteritis, AKI, and orthostatic syncope.  Patient returns to the ED complaining of worsening abdominal pain, vomiting, and persistent diarrhea.  GI pathogen panel and C. difficile testing were negative during recent hospitalization.    Assessment and Plan:   Distal SBO/ Ileus:  Resolved.        Right sided flank pain  With CT abd and pelvis showing dilation of intra calyceal system and right kidney with mild perinephric stranding and mild hydronephrosis .  He probably passed a renal stone on 12/8. Urology and nephrology on board and appreciate recommendations.   resolved.      Possible Pyelonephritis:  CT abdomen and pelvis shows dilation of intra calyceal system and right kidney with mild perinephric stranding . Currently on ciprofloxacin to complete a 5 day course.      Acute on stage 3a CKD:   Probably a combination of dehydration, pyelonephritis.  Creatinine improved with IV fluids.    Body mass index is 38.97 kg/m. Obesity       Leukocytosis From pyelonephritis.  Resolved.  Recommend outpatient follow u pwith cbc in one week.       hypokalemia Replaced.      Estimated body mass index is 38.97 kg/m as calculated from the following:   Height as of this encounter: 5\' 9"  (1.753 m).   Weight as of  this encounter: 119.7 kg.     Consultants: nephrology Urology.  Procedures performed: none.   Disposition: Home Diet recommendation:  Discharge Diet Orders (From admission, onward)     Start     Ordered   02/24/23 0000  Diet - low sodium heart healthy        02/24/23 1335           Regular diet DISCHARGE MEDICATION: Allergies as of 02/24/2023       Reactions   Dairy Aid [tilactase] Other (See Comments)   GI intolerance        Medication List     STOP taking these medications    ibuprofen 200 MG tablet Commonly known as: ADVIL       TAKE these medications    ciprofloxacin 500 MG tablet Commonly known as: CIPRO Take 1 tablet (500 mg total) by mouth 2 (two) times daily for 5 days. Notes to patient: Take with meals to avoid nausea   Depo-Testosterone 200 MG/ML injection Generic drug: testosterone cypionate Inject 100 mg into the muscle every 14 (fourteen) days.        Discharge Exam: Filed Weights   02/21/23 1100  Weight: 119.7 kg   General exam: Appears calm and comfortable  Respiratory system: Clear to auscultation. Respiratory effort normal. Cardiovascular system: S1 & S2 heard, RRR. No JVD, murmurs, rubs, gallops or clicks. No pedal edema. Gastrointestinal system: Abdomen is nondistended, soft and nontender. No organomegaly or masses  felt. Normal bowel sounds heard. Central nervous system: Alert and oriented. No focal neurological deficits. Extremities: Symmetric 5 x 5 power. Skin: No rashes, lesions or ulcers Psychiatry: Judgement and insight appear normal. Mood & affect appropriate.    Condition at discharge: good  The results of significant diagnostics from this hospitalization (including imaging, microbiology, ancillary and laboratory) are listed below for reference.   Imaging Studies: CT RENAL STONE STUDY Result Date: 02/21/2023 CLINICAL DATA:  Abdominal/flank pain EXAM: CT ABDOMEN AND PELVIS WITHOUT CONTRAST TECHNIQUE: Multidetector  CT imaging of the abdomen and pelvis was performed following the standard protocol without IV contrast. RADIATION DOSE REDUCTION: This exam was performed according to the departmental dose-optimization program which includes automated exposure control, adjustment of the mA and/or kV according to patient size and/or use of iterative reconstruction technique. COMPARISON:  02/18/2023 and 02/14/2023 FINDINGS: Lower chest: Lung bases are clear. Hepatobiliary: Unenhanced liver is unremarkable. Gallbladder is unremarkable. No intrahepatic or extrahepatic ductal dilatation. Pancreas: Within normal limits. Spleen: Within normal limits. Adrenals/Urinary Tract: Adrenal glands are within normal limits. Kidneys are within normal limits. Mild right hydronephrosis, with associated mild periureteral stranding along the proximal/mid right ureter (series 3/image 60). No ureteral or bladder calculi. This appearance raises concern for ascending infection, although pyelonephritis was not evident on recent enhanced CT. Bladder is underdistended but unremarkable. Stomach/Bowel: Stomach is notable for a small hiatal hernia. No evidence of bowel obstruction. Normal appendix (series 3/image 55). No colonic wall thickening or inflammatory changes. Vascular/Lymphatic: No evidence of abdominal aortic aneurysm. No suspicious abdominopelvic lymphadenopathy. Reproductive: Prostate is unremarkable. Other: No abdominopelvic ascites. Mild fat in the left inguinal canal. Musculoskeletal: Visualized osseous structures are within normal limits. IMPRESSION: Mild right hydronephrosis, with associated mild periureteral stranding along the proximal/mid right ureter, new. Ascending infection is possible. Correlate with urinalysis. Otherwise negative CT abdomen/pelvis. Electronically Signed   By: Charline Bills M.D.   On: 02/21/2023 19:07   DG Lumbar Spine 2-3 Views Result Date: 02/20/2023 CLINICAL DATA:  Back pain, history of kidney stones. EXAM:  LUMBAR SPINE - 2-3 VIEW COMPARISON:  CT abdomen pelvis dated 02/18/2023. FINDINGS: There is no evidence of lumbar spine fracture. Alignment is normal. There is moderate degenerative disc and joint disease at L5-S1 with mild to moderate neuroforaminal stenosis at this level. No radiopaque calculi overlying the expected course of the urinary tract. 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 Electronically Signed   By: Romona Curls M.D.   On: 02/20/2023 17:43   US RENAL Result Date: 02/20/2023 CLINICAL DATA:  Acute kidney injury. EXAM: RENAL / URINARY TRACT ULTRASOUND COMPLETE COMPARISON:  CT, 02/18/2023. FINDINGS: Right Kidney: Renal measurements: 11.6 x 5.8 x 5.6 cm = volume: 196.7 ML. Normal parenchymal echogenicity. No mass or stone. Mild dilation of the intrarenal collecting system, not evident on the recent prior CT. Left Kidney: Renal measurements: 12.3 x 6.6 x 6.6 cm = volume: 273.7 mL. Echogenicity within normal limits. No mass or hydronephrosis visualized. Bladder: Appears normal for degree of bladder distention. Other: None. IMPRESSION: 1. Mild dilation of the right intrarenal collecting system. This may be physiologic. This was not evident on the recent CT. 2. Otherwise negative renal ultrasound. Electronically Signed   By: Amie Portland M.D.   On: 02/20/2023 16:42   DG Abd Portable 1V-Small Bowel Obstruction Protocol-initial, 8 hr delay Result Date: 02/19/2023 CLINICAL DATA:  Re-evaluate small-bowel obstruction. EXAM: PORTABLE ABDOMEN - 1 VIEW COMPARISON:  CT  from yesterday FINDINGS: 2 supine views of the abdomen and pelvis. Contrast has passed into a normal caliber colon, including to the level of the rectum. Minimal residual small bowel contrast identified. No significant small bowel dilatation. No gross free intraperitoneal air. IMPRESSION: Passage of contrast to the level of the rectum, arguing against clinically  significant obstruction. Electronically Signed   By: Jeronimo Greaves M.D.   On: 02/19/2023 13:51   CT ABDOMEN PELVIS W CONTRAST Result Date: 02/18/2023 CLINICAL DATA:  Abdominal pain, acute, nonlocalized EXAM: CT ABDOMEN AND PELVIS WITH CONTRAST TECHNIQUE: Multidetector CT imaging of the abdomen and pelvis was performed using the standard protocol following bolus administration of intravenous contrast. RADIATION DOSE REDUCTION: This exam was performed according to the departmental dose-optimization program which includes automated exposure control, adjustment of the mA and/or kV according to patient size and/or use of iterative reconstruction technique. CONTRAST:  75mL OMNIPAQUE IOHEXOL 350 MG/ML SOLN COMPARISON:  02/14/2023 FINDINGS: Lower chest: No acute abnormality. Small fluid-filled hiatal hernia. Hepatobiliary: No focal hepatic abnormality. Gallbladder unremarkable. Pancreas: No focal abnormality or ductal dilatation. Spleen: No focal abnormality.  Normal size. Adrenals/Urinary Tract: No adrenal abnormality. No focal renal abnormality. No stones or hydronephrosis. Urinary bladder is unremarkable. Stomach/Bowel: Normal appendix. There are dilated fluid-filled small bowel loops into the pelvis measuring up 2 4 cm in diameter. Distal small bowel is decompressed. This is concerning for distal small bowel obstruction. Large bowel is fluid-filled but predominantly decompressed. Stomach decompressed. Vascular/Lymphatic: No evidence of aneurysm or adenopathy. Reproductive: No visible focal abnormality. Other: No free fluid or free air. Musculoskeletal: No acute bony abnormality. IMPRESSION: Dilated small bowel into the pelvis with gradual caliber change to decompressed distal small bowel. While there is no well-defined focal change in bowel caliber, appearance is concerning for distal small-bowel obstruction. Electronically Signed   By: Charlett Nose M.D.   On: 02/18/2023 21:22   CT ABDOMEN PELVIS W CONTRAST Result  Date: 02/14/2023 CLINICAL DATA:  Diarrhea and emesis EXAM: CT ABDOMEN AND PELVIS WITH CONTRAST TECHNIQUE: Multidetector CT imaging of the abdomen and pelvis was performed using the standard protocol following bolus administration of intravenous contrast. RADIATION DOSE REDUCTION: This exam was performed according to the departmental dose-optimization program which includes automated exposure control, adjustment of the mA and/or kV according to patient size and/or use of iterative reconstruction technique. CONTRAST:  75mL OMNIPAQUE IOHEXOL 350 MG/ML SOLN COMPARISON:  CT report 04/17/2001 FINDINGS: Lower chest: Lung bases are clear Hepatobiliary: No focal liver abnormality is seen. No gallstones, gallbladder wall thickening, or biliary dilatation. Pancreas: Unremarkable. No pancreatic ductal dilatation or surrounding inflammatory changes. Spleen: Normal in size without focal abnormality. Adrenals/Urinary Tract: Adrenal glands are unremarkable. Kidneys are normal, without renal calculi, focal lesion, or hydronephrosis. Bladder is unremarkable. Stomach/Bowel: Mild fluid distension of stomach. Small hiatal hernia diffuse fluid-filled small and large bowel with some proximal small bowel distension up to 3.3 cm but no well-defined transition point. Mild small bowel wall thickening in the right upper quadrant, for example series 2, image 38. Negative appendix. Vascular/Lymphatic: No significant vascular findings are present. No enlarged abdominal or pelvic lymph nodes. Reproductive: Prostate is unremarkable. Other: Negative for pelvic effusion or free air. Small fat containing left inguinal hernia Musculoskeletal: No acute or significant osseous findings. IMPRESSION: 1. Diffuse fluid-filled small and large bowel with some proximal small bowel distension but no well-defined transition point. Mild areas of small bowel wall thickening. Findings are suggestive of enteritis/diarrheal illness. No convincing obstruction 2. Small  hiatal hernia.  Electronically Signed   By: Jasmine Pang M.D.   On: 02/14/2023 21:20    Microbiology: Results for orders placed or performed during the hospital encounter of 02/14/23  Gastrointestinal Panel by PCR , Stool     Status: None   Collection Time: 02/14/23  9:19 PM   Specimen: Stool  Result Value Ref Range Status   Campylobacter species NOT DETECTED NOT DETECTED Final   Plesimonas shigelloides NOT DETECTED NOT DETECTED Final   Salmonella species NOT DETECTED NOT DETECTED Final   Yersinia enterocolitica NOT DETECTED NOT DETECTED Final   Vibrio species NOT DETECTED NOT DETECTED Final   Vibrio cholerae NOT DETECTED NOT DETECTED Final   Enteroaggregative E coli (EAEC) NOT DETECTED NOT DETECTED Final   Enteropathogenic E coli (EPEC) NOT DETECTED NOT DETECTED Final   Enterotoxigenic E coli (ETEC) NOT DETECTED NOT DETECTED Final   Shiga like toxin producing E coli (STEC) NOT DETECTED NOT DETECTED Final   Shigella/Enteroinvasive E coli (EIEC) NOT DETECTED NOT DETECTED Final   Cryptosporidium NOT DETECTED NOT DETECTED Final   Cyclospora cayetanensis NOT DETECTED NOT DETECTED Final   Entamoeba histolytica NOT DETECTED NOT DETECTED Final   Giardia lamblia NOT DETECTED NOT DETECTED Final   Adenovirus F40/41 NOT DETECTED NOT DETECTED Final   Astrovirus NOT DETECTED NOT DETECTED Final   Norovirus GI/GII NOT DETECTED NOT DETECTED Final   Rotavirus A NOT DETECTED NOT DETECTED Final   Sapovirus (I, II, IV, and V) NOT DETECTED NOT DETECTED Final    Comment: Performed at [facility], [address] [place], [place] [address]  Resp panel by RT-PCR (RSV, Flu A&B, Covid)     Status: None   Collection Time: 02/14/23  9:19 PM   Specimen: Nasal Swab  Result Value Ref Range Status   SARS Coronavirus 2 by RT PCR NEGATIVE NEGATIVE Final   Influenza A by PCR NEGATIVE NEGATIVE Final   Influenza B by PCR NEGATIVE NEGATIVE Final    Comment: (NOTE) The Xpert Xpress SARS-CoV-2/FLU/RSV plus  assay is intended as an aid in the diagnosis of influenza from Nasopharyngeal swab specimens and should not be used as a sole basis for treatment. Nasal washings and aspirates are unacceptable for Xpert Xpress SARS-CoV-2/FLU/RSV testing.  Fact Sheet for Patients: BloggerCourse.com  Fact Sheet for Healthcare Providers: SeriousBroker.it  This test is not yet approved or cleared by the [place] FDA and has been authorized for detection and/or diagnosis of SARS-CoV-2 by FDA under an Emergency Use Authorization (EUA). This EUA will remain in effect (meaning this test can be used) for the duration of the COVID-19 declaration under Section 564(b)(1) of the Act, 21 U.S.C. section 360bbb-3(b)(1), unless the authorization is terminated or revoked.     Resp Syncytial Virus by PCR NEGATIVE NEGATIVE Final    Comment: (NOTE) Fact Sheet for Patients: BloggerCourse.com  Fact Sheet for Healthcare Providers: SeriousBroker.it  This test is not yet approved or cleared by the [place] FDA and has been authorized for detection and/or diagnosis of SARS-CoV-2 by FDA under an Emergency Use Authorization (EUA). This EUA will remain in effect (meaning this test can be used) for the duration of the COVID-19 declaration under Section 564(b)(1) of the Act, 21 U.S.C. section 360bbb-3(b)(1), unless the authorization is terminated or revoked.  Performed at [facility], [address] [place], [place] [address]   C Difficile Quick Screen w PCR reflex     Status: None   Collection Time: 02/14/23  9:30 PM  Specimen: Stool  Result Value Ref Range Status   C Diff antigen NEGATIVE NEGATIVE Final   C Diff toxin NEGATIVE NEGATIVE Final   C Diff interpretation No C. difficile detected.  Final    Comment: Performed at [facility], [address], [place], [place] [address]     Labs: CBC: Recent Labs  Lab 02/21/23 0531 02/23/23 0734 02/24/23 0537  WBC 10.8* 13.4* 11.7*  NEUTROABS  --   --  7.1  HGB 13.4 14.1 12.9*  HCT 40.4 41.8 38.7*  MCV 84.0 83.1 83.2  PLT 206 265 258   Basic Metabolic Panel: Recent Labs  Lab 02/21/23 0531 02/22/23 0619 02/23/23 0734 02/24/23 0538  NA 137 138 136 137  K 3.3* 3.5 3.6 3.3*  CL 104 104 101 103  CO2 24 26 28 26   GLUCOSE 99 95 97 88  BUN 7 8 9 10   CREATININE 1.83* 1.75* 1.64* 1.44*  CALCIUM 8.2* 8.1* 8.4* 7.7*  PHOS  --   --  3.3 3.2   Liver Function Tests: Recent Labs  Lab 02/22/23 0619 02/23/23 0734 02/24/23 0538  AST 15  --   --   ALT 26  --   --   ALKPHOS 50  --   --   BILITOT 0.4  --   --   PROT 5.6*  --   --   ALBUMIN 2.6* 2.8* 2.6*   CBG: No results for input(s): "GLUCAP" in the last 168 hours.  Discharge time spent: 37 minutes.   Signed: Kathlen Mody, MD [facility]

## 2023-03-22 ENCOUNTER — Ambulatory Visit
Admission: EM | Admit: 2023-03-22 | Discharge: 2023-03-22 | Disposition: A | Discharge status: Home / Self Care | Payer: No Typology Code available for payment source | Attending: Emergency Medicine | Admitting: Emergency Medicine

## 2023-03-22 DIAGNOSIS — J45901 Unspecified asthma with (acute) exacerbation: Secondary | ICD-10-CM

## 2023-03-22 DIAGNOSIS — J069 Acute upper respiratory infection, unspecified: Secondary | ICD-10-CM | POA: Diagnosis not present

## 2023-03-22 MED ORDER — ALBUTEROL SULFATE (2.5 MG/3ML) 0.083% IN NEBU: 2.5000 mg | INHALATION_SOLUTION | Freq: Four times a day (QID) | RESPIRATORY_TRACT | 0 refills | Status: AC | PRN

## 2023-03-22 MED ORDER — PREDNISONE 10 MG PO TABS: 40.0000 mg | ORAL_TABLET | Freq: Every day | ORAL | 0 refills | Status: AC

## 2023-03-22 MED ORDER — ALBUTEROL SULFATE HFA 108 (90 BASE) MCG/ACT IN AERS: 1.0000 | INHALATION_SPRAY | Freq: Four times a day (QID) | RESPIRATORY_TRACT | 0 refills | Status: AC | PRN

## 2023-03-22 NOTE — ED Triage Notes (Signed)
 Patient to Urgent Care with complaints of cough/ fevers/ runny nose.  Symptoms started one week ago. Hx of asthma- increased albuterol usage (requests refill).  Meds: tylenol

## 2023-03-22 NOTE — ED Provider Notes (Signed)
 [facility]    CSN: [id number] Arrival date & time: 03/22/23  9062      History   Chief Complaint Chief Complaint  Patient presents with   URI    HPI Russell Stephens is a 38 y.o. male.  Accompanied by his wife and children, patient presents with runny nose, congestion, cough, fever x 1 week.  He reports mild wheezing and shortness of breath especially at night.  No fever or chest pain.  Patient reports history of asthma and states he has been using albuterol  inhaler and nebulizer but needs a refill.  The history is provided by the patient and medical records.    History reviewed. No pertinent past medical history.  Patient Active Problem List   Diagnosis Date Noted   Hypokalemia 02/19/2023   Leukocytosis 02/19/2023   Normal anion gap metabolic acidosis 02/19/2023   Elevated lipase 02/19/2023   SBO (small bowel obstruction) (HCC) 02/18/2023   Gastroenteritis, acute 02/15/2023   Gastroenteritis 02/14/2023   AKI (acute kidney injury) (HCC) 02/14/2023   Syncope and collapse 02/14/2023    Past Surgical History:  Procedure Laterality Date   HERNIA REPAIR         Home Medications    Prior to Admission medications   Medication Sig Start Date End Date Taking? Authorizing Provider  albuterol  (PROVENTIL ) (2.5 MG/3ML) 0.083% nebulizer solution Take 3 mLs (2.5 mg total) by nebulization every 6 (six) hours as needed for wheezing or shortness of breath. 03/22/23  Yes Corlis Burnard DEL, NP  albuterol  (VENTOLIN  HFA) 108 (90 Base) MCG/ACT inhaler Inhale 1-2 puffs into the lungs every 6 (six) hours as needed. 03/22/23  Yes Corlis Burnard DEL, NP  predniSONE  (DELTASONE ) 10 MG tablet Take 4 tablets (40 mg total) by mouth daily for 5 days. 03/22/23 03/27/23 Yes Corlis Burnard DEL, NP  testosterone cypionate (DEPO-TESTOSTERONE) 200 MG/ML injection Inject 100 mg into the muscle every 14 (fourteen) days.    [provider]    Family History History reviewed. No pertinent family  history.  Social History Social History   Tobacco Use   Smoking status: Never   Smokeless tobacco: Never  Substance Use Topics   Alcohol use: Yes   Drug use: Not Currently     Allergies   Dairy aid [tilactase]   Review of Systems Review of Systems  Constitutional:  Negative for chills and fever.  HENT:  Positive for congestion and rhinorrhea. Negative for ear pain and sore throat.   Respiratory:  Positive for cough, shortness of breath and wheezing.   Cardiovascular:  Negative for chest pain and palpitations.     Physical Exam Triage Vital Signs ED Triage Vitals [03/22/23 0952]  Encounter Vitals Group     BP 125/89     Systolic BP Percentile      Diastolic BP Percentile      Pulse Rate 86     Resp 18     Temp 98.5 F (36.9 C)     Temp src      SpO2 96 %     Weight      Height      Head Circumference      Peak Flow      Pain Score      Pain Loc      Pain Education      Exclude from Growth Chart    No data found.  Updated Vital Signs BP 125/89   Pulse 86   Temp 98.5 F (36.9  C)   Resp 18   SpO2 96%   Visual Acuity Right Eye Distance:   Left Eye Distance:   Bilateral Distance:    Right Eye Near:   Left Eye Near:    Bilateral Near:     Physical Exam Vitals and nursing note reviewed.  Constitutional:      General: He is not in acute distress.    Appearance: He is well-developed.  HENT:     Right Ear: Tympanic membrane normal.     Left Ear: Tympanic membrane normal.     Nose: Nose normal.     Mouth/Throat:     Mouth: Mucous membranes are moist.     Pharynx: Oropharynx is clear.  Cardiovascular:     Rate and Rhythm: Normal rate and regular rhythm.     Heart sounds: Normal heart sounds.  Pulmonary:     Effort: Pulmonary effort is normal. No respiratory distress.     Breath sounds: Normal breath sounds. Decreased air movement present.  Musculoskeletal:     Cervical back: Neck supple.  Skin:    General: Skin is warm and dry.   Neurological:     Mental Status: He is alert.      UC Treatments / Results  Labs (all labs ordered are listed, but only abnormal results are displayed) Labs Reviewed - No data to display  EKG   Radiology No results found.  Procedures Procedures (including critical care time)  Medications Ordered in UC Medications - No data to display  Initial Impression / Assessment and Plan / UC Course  I have reviewed the triage vital signs and the nursing notes.  Pertinent labs & imaging results that were available during my care of the patient were reviewed by me and considered in my medical decision making (see chart for details).    Asthma exacerbation, viral URI.  Afebrile and vital signs are stable.  Patient's air movement is slightly tight but no wheezing at this time.  O2 sat 96% on room air.  Patient's son and daughter tested negative for strep flu and COVID today.  Treating patient today with refill of albuterol  inhaler and albuterol  nebulizer solution.  Also treating with 5-day course of prednisone .  Instructed patient to follow-up with his PCP if he is not improving.  Education provided on asthma and viral URI.  He agrees to plan of care.  Final Clinical Impressions(s) / UC Diagnoses   Final diagnoses:  Asthma with acute exacerbation, unspecified asthma severity, unspecified whether persistent  Viral URI     Discharge Instructions      Use the albuterol  and take the prednisone  as directed.  Follow-up with your primary care provider if your symptoms are not improving.      ED Prescriptions     Medication Sig Dispense Auth. Provider   albuterol  (VENTOLIN  HFA) 108 (90 Base) MCG/ACT inhaler Inhale 1-2 puffs into the lungs every 6 (six) hours as needed. 18 g Corlis Burnard DEL, NP   albuterol  (PROVENTIL ) (2.5 MG/3ML) 0.083% nebulizer solution Take 3 mLs (2.5 mg total) by nebulization every 6 (six) hours as needed for wheezing or shortness of breath. 75 mL Corlis Burnard DEL, NP    predniSONE  (DELTASONE ) 10 MG tablet Take 4 tablets (40 mg total) by mouth daily for 5 days. 20 tablet Corlis Burnard DEL, NP      PDMP not reviewed this encounter.   Corlis Burnard DEL, NP 03/22/23 1038

## 2023-03-22 NOTE — Discharge Instructions (Addendum)
 Use the albuterol and take the prednisone as directed.  Follow-up with your primary care provider if your symptoms are not improving.

## 2023-08-24 ENCOUNTER — Ambulatory Visit
Admission: RE | Admit: 2023-08-24 | Discharge: 2023-08-24 | Disposition: A | Discharge status: Home / Self Care | Payer: Self-pay | Source: Ambulatory Visit | Attending: Emergency Medicine | Admitting: Emergency Medicine

## 2023-08-24 VITALS — BP 127/81 | HR 85 | Temp 98.7°F | Resp 16 | Ht 68.0 in | Wt 250.0 lb

## 2023-08-24 DIAGNOSIS — J039 Acute tonsillitis, unspecified: Secondary | ICD-10-CM | POA: Insufficient documentation

## 2023-08-24 LAB — POCT RAPID STREP A (OFFICE): Rapid Strep A Screen: NEGATIVE

## 2023-08-24 MED ORDER — AMOXICILLIN 500 MG PO CAPS: 500.0000 mg | ORAL_CAPSULE | Freq: Two times a day (BID) | ORAL | 0 refills | Status: AC

## 2023-08-24 MED ORDER — PREDNISONE 10 MG (21) PO TBPK: ORAL_TABLET | Freq: Every day | ORAL | 0 refills | Status: AC

## 2023-08-24 MED ORDER — LIDOCAINE VISCOUS HCL 2 % MT SOLN: 15.0000 mL | OROMUCOSAL | 0 refills | Status: AC | PRN

## 2023-08-24 NOTE — ED Provider Notes (Signed)
 Russell Stephens    CSN: [id number] Arrival date & time: 08/24/23  1007      History   Chief Complaint Chief Complaint  Patient presents with   Sore Throat   Otalgia    HPI Russell Stephens is a 38 y.o. male.   Patient presents for evaluation of bilateral ear pain and a sore throat beginning 3 days ago, experiencing a mild nonproductive cough starting today.  No known sick contacts prior.  Tolerating food and liquids.  Has attempted use of ibuprofen and Tylenol  which have been ineffective.  Denies fever, shortness of breath, wheezing or congestion.   History reviewed. No pertinent past medical history.  Patient Active Problem List   Diagnosis Date Noted   Hypokalemia 02/19/2023   Leukocytosis 02/19/2023   Normal anion gap metabolic acidosis 02/19/2023   Elevated lipase 02/19/2023   SBO (small bowel obstruction) (HCC) 02/18/2023   Gastroenteritis, acute 02/15/2023   Gastroenteritis 02/14/2023   AKI (acute kidney injury) (HCC) 02/14/2023   Syncope and collapse 02/14/2023    Past Surgical History:  Procedure Laterality Date   HERNIA REPAIR         Home Medications    Prior to Admission medications   Medication Sig Start Date End Date Taking? Authorizing Provider  albuterol  (PROVENTIL ) (2.5 MG/3ML) 0.083% nebulizer solution Take 3 mLs (2.5 mg total) by nebulization every 6 (six) hours as needed for wheezing or shortness of breath. 03/22/23  Yes Wellington Half, NP  albuterol  (VENTOLIN  HFA) 108 (90 Base) MCG/ACT inhaler Inhale 1-2 puffs into the lungs every 6 (six) hours as needed. 03/22/23  Yes Wellington Half, NP  amoxicillin  (AMOXIL ) 500 MG capsule Take 1 capsule (500 mg total) by mouth 2 (two) times daily for 7 days. 08/24/23 08/31/23 Yes Gage Weant R, NP  lidocaine  (XYLOCAINE ) 2 % solution Use as directed 15 mLs in the mouth or throat every 4 (four) hours as needed. 08/24/23  Yes Ndea Kilroy R, NP  predniSONE  (STERAPRED UNI-PAK 21 TAB) 10 MG (21) TBPK tablet Take  by mouth daily. Take 6 tabs by mouth daily  for 1 days, then 5 tabs for 1 days, then 4 tabs for 1 days, then 3 tabs for 1 days, 2 tabs for 1 days, then 1 tab by mouth daily for 1 days 08/24/23  Yes Cedar Roseman R, NP  testosterone cypionate (DEPO-TESTOSTERONE) 200 MG/ML injection Inject 100 mg into the muscle every 14 (fourteen) days.   Yes [provider]    Family History History reviewed. No pertinent family history.  Social History Social History   Tobacco Use   Smoking status: Never   Smokeless tobacco: Never  Substance Use Topics   Alcohol use: Yes   Drug use: Not Currently     Allergies   Dairy aid [tilactase]   Review of Systems Review of Systems   Physical Exam Triage Vital Signs ED Triage Vitals  Encounter Vitals Group     BP 08/24/23 1012 127/81     Systolic BP Percentile --      Diastolic BP Percentile --      Pulse Rate 08/24/23 1012 85     Resp 08/24/23 1012 16     Temp 08/24/23 1012 98.7 F (37.1 C)     Temp Source 08/24/23 1012 Oral     SpO2 08/24/23 1012 95 %     Weight 08/24/23 1011 250 lb (113.4 kg)     Height 08/24/23 1011 5' 8 (1.727 m)  Head Circumference --      Peak Flow --      Pain Score 08/24/23 1020 5     Pain Loc --      Pain Education --      Exclude from Growth Chart --    No data found.  Updated Vital Signs BP 127/81 (BP Location: Left Arm)   Pulse 85   Temp 98.7 F (37.1 C) (Oral)   Resp 16   Ht 5' 8 (1.727 m)   Wt 250 lb (113.4 kg)   SpO2 95%   BMI 38.01 kg/m   Visual Acuity Right Eye Distance:   Left Eye Distance:   Bilateral Distance:    Right Eye Near:   Left Eye Near:    Bilateral Near:     Physical Exam Constitutional:      Appearance: He is well-developed.  HENT:     Head: Normocephalic.     Right Ear: Tympanic membrane and ear canal normal.     Left Ear: Tympanic membrane and ear canal normal.     Nose: Congestion present.     Mouth/Throat:     Pharynx: No posterior oropharyngeal  erythema.     Tonsils: Tonsillar exudate present. 3+ on the right. 3+ on the left.  Cardiovascular:     Rate and Rhythm: Normal rate and regular rhythm.     Heart sounds: Normal heart sounds.  Pulmonary:     Effort: Pulmonary effort is normal.     Breath sounds: Normal breath sounds.  Neurological:     Mental Status: He is alert.      UC Treatments / Results  Labs (all labs ordered are listed, but only abnormal results are displayed) Labs Reviewed  POCT RAPID STREP A (OFFICE) - Normal  CULTURE, GROUP A STREP [facility])    EKG   Radiology No results found.  Procedures Procedures (including critical care time)  Medications Ordered in UC Medications - No data to display  Initial Impression / Assessment and Plan / UC Course  I have reviewed the triage vital signs and the nursing notes.  Pertinent labs & imaging results that were available during my care of the patient were reviewed by me and considered in my medical decision making (see chart for details).  acute tonsillitis  Vitals stable, patient in no signs distress nontoxic-appearing, tonsillar adenopathy with significant exudate present on exam, no erythema noted, no abnormality to the bilateral ears, rapid strep test negative, sent for culture, empirically placed on amoxicillin  based on appearance of the throat, additionally prescribed prednisone  and viscous lidocaine  for comfort, advised supportive care with follow-up as needed Final Clinical Impressions(s) / UC Diagnoses   Final diagnoses:  Acute tonsillitis, unspecified etiology   Discharge Instructions      Your evaluated for your throat and ears, on exam there is tonsillar enlargement and [name] patches but the throat is not red and no abnormality to the ear  Rapid strep test is negative for bacteria, has been sent to the lab to determine if bacteria will grow, you will be notified if this occurs  Based on the appearance of your throat you have been placed on  antibiotics, take amoxicillin  twice daily for 7 days  For management of pain begin prednisone  every morning with food to reduce inflammation and help with discomfort, may take Tylenol  additionally  May gargle and spit lidocaine  solution as needed for temporary relief of throat pain  May attempt use of salt water gargles, throat lozenges  warm fluids and soft liquids  May follow-up with urgent care as needed  ED Prescriptions     Medication Sig Dispense Auth. Provider   amoxicillin  (AMOXIL ) 500 MG capsule Take 1 capsule (500 mg total) by mouth 2 (two) times daily for 7 days. 14 capsule Chrisean Kloth R, NP   predniSONE  (STERAPRED UNI-PAK 21 TAB) 10 MG (21) TBPK tablet Take by mouth daily. Take 6 tabs by mouth daily  for 1 days, then 5 tabs for 1 days, then 4 tabs for 1 days, then 3 tabs for 1 days, 2 tabs for 1 days, then 1 tab by mouth daily for 1 days 21 tablet Bosten Newstrom R, NP   lidocaine  (XYLOCAINE ) 2 % solution Use as directed 15 mLs in the mouth or throat every 4 (four) hours as needed. 100 mL Reena Canning, NP      PDMP not reviewed this encounter.   Reena Canning, NP 08/24/23 1039

## 2023-08-24 NOTE — Discharge Instructions (Addendum)
 Your evaluated for your throat and ears, on exam there is tonsillar enlargement and Codylee Patil patches but the throat is not red and no abnormality to the ear  Rapid strep test is negative for bacteria, has been sent to the lab to determine if bacteria will grow, you will be notified if this occurs  Based on the appearance of your throat you have been placed on antibiotics, take amoxicillin  twice daily for 7 days  For management of pain begin prednisone  every morning with food to reduce inflammation and help with discomfort, may take Tylenol  additionally  May gargle and spit lidocaine  solution as needed for temporary relief of throat pain  May attempt use of salt water gargles, throat lozenges warm fluids and soft liquids  May follow-up with urgent care as needed

## 2023-08-24 NOTE — ED Triage Notes (Signed)
 Pt c/o sore throat & ear pain R>L x3 days. Has tried IBU w/o relief.

## 2023-08-27 LAB — CULTURE, GROUP A STREP (THRC)

## 2023-08-29 ENCOUNTER — Ambulatory Visit (HOSPITAL_COMMUNITY): Payer: Self-pay

## 2023-08-31 ENCOUNTER — Encounter (INDEPENDENT_AMBULATORY_CARE_PROVIDER_SITE_OTHER): Payer: Self-pay

## 2023-10-28 ENCOUNTER — Institutional Professional Consult (permissible substitution) (INDEPENDENT_AMBULATORY_CARE_PROVIDER_SITE_OTHER): Admitting: Otolaryngology

## 2023-10-31 ENCOUNTER — Telehealth (INDEPENDENT_AMBULATORY_CARE_PROVIDER_SITE_OTHER): Payer: Self-pay | Admitting: Otolaryngology

## 2023-10-31 NOTE — Telephone Encounter (Signed)
 LVM to confirm appt & location [phone] afm

## 2023-11-01 ENCOUNTER — Encounter (INDEPENDENT_AMBULATORY_CARE_PROVIDER_SITE_OTHER): Payer: Self-pay | Admitting: Otolaryngology

## 2023-11-01 ENCOUNTER — Ambulatory Visit (INDEPENDENT_AMBULATORY_CARE_PROVIDER_SITE_OTHER): Admitting: Otolaryngology

## 2023-11-01 VITALS — BP 128/85 | HR 78

## 2023-11-01 DIAGNOSIS — K219 Gastro-esophageal reflux disease without esophagitis: Secondary | ICD-10-CM | POA: Diagnosis not present

## 2023-11-01 DIAGNOSIS — J370 Chronic laryngitis: Secondary | ICD-10-CM

## 2023-11-01 DIAGNOSIS — J351 Hypertrophy of tonsils: Secondary | ICD-10-CM

## 2023-11-01 DIAGNOSIS — R490 Dysphonia: Secondary | ICD-10-CM | POA: Diagnosis not present

## 2023-11-01 DIAGNOSIS — J3089 Other allergic rhinitis: Secondary | ICD-10-CM

## 2023-11-01 DIAGNOSIS — J3501 Chronic tonsillitis: Secondary | ICD-10-CM

## 2023-11-01 DIAGNOSIS — R0982 Postnasal drip: Secondary | ICD-10-CM

## 2023-11-01 MED ORDER — FLUTICASONE PROPIONATE 50 MCG/ACT NA SUSP: 2.0000 | Freq: Two times a day (BID) | NASAL | 6 refills | Status: AC

## 2023-11-01 MED ORDER — LEVOCETIRIZINE DIHYDROCHLORIDE 5 MG PO TABS: 5.0000 mg | ORAL_TABLET | Freq: Every evening | ORAL | 3 refills | Status: AC

## 2023-11-01 NOTE — Patient Instructions (Signed)

## 2023-11-01 NOTE — Progress Notes (Signed)
 ENT CONSULT:  Reason for Consult: chronic tonsillitis    HPI: Discussed the use of AI scribe software for clinical note transcription with the patient, who gave verbal consent to proceed.  History of Present Illness Russell Stephens is a 38 year old male who presents for evaluation of recurrent tonsillitis and laryngitis. He was referred by his primary care doctor and an urgent care provider for consideration of tonsillectomy.  He experiences frequent episodes of tonsillitis and laryngitis over the past two years, with two to three episodes occurring annually. During these episodes, he has significant swelling of the salivary glands and loss of voice. Antibiotics are typically required for tonsillitis due to the severity of symptoms. For laryngitis, he manages with home remedies such as hot water, honey, and lemon, and rests his voice until it returns.  He has a history of stomach acid issues, which are now under control. He experiences snoring and uses a mouth guard. A sleep study conducted two years ago indicated mild sleep apnea, and he is awaiting another study to assess any progression.  No current smoking history, having quit at age 77. He has been hospitalized in the past due to dehydration, attributed to not maintaining adequate fluid intake during illness. He reports that his symptoms, including hoarseness and scratchiness in his throat during conversations, have improved since starting treatment for reflux.  No history of strokes or heart attacks.    Records Reviewed:  UC visit at [facility] Chesley Blush MD 08/24/23   Patient presents for evaluation of bilateral ear pain and a sore throat beginning 3 days ago, experiencing a mild nonproductive cough starting today.  No known sick contacts prior.  Tolerating food and liquids.  Has attempted use of ibuprofen and Tylenol  which have been ineffective.  Denies fever, shortness of breath, wheezing or congestion.     History reviewed. No  pertinent past medical history.  Past Surgical History:  Procedure Laterality Date   HERNIA REPAIR      History reviewed. No pertinent family history.  Social History:  reports that he has never smoked. He has never used smokeless tobacco. He reports current alcohol use. He reports that he does not currently use drugs.  Allergies:  Allergies  Allergen Reactions   Dairy Aid [Tilactase] Other (See Comments)    GI intolerance    Medications: I have reviewed the patient's current medications.  The PMH, PSH, Medications, Allergies, and SH were reviewed and updated.  ROS: Constitutional: Negative for fever, weight loss and weight gain. Cardiovascular: Negative for chest pain and dyspnea on exertion. Respiratory: Is not experiencing shortness of breath at rest. Gastrointestinal: Negative for nausea and vomiting. Neurological: Negative for headaches. Psychiatric: The patient is not nervous/anxious  Blood pressure 128/85, pulse 78, SpO2 95%.  PHYSICAL EXAM:  Exam: General: Well-developed, well-nourished Respiratory Respiratory effort: Equal inspiration and expiration without stridor Cardiovascular Peripheral Vascular: Warm extremities with equal color/perfusion Eyes: No nystagmus with equal extraocular motion bilaterally Neuro/Psych/Balance: Patient oriented to person, place, and time; Appropriate mood and affect; Gait is intact with no imbalance; Cranial nerves I-XII are intact Head and Face Inspection: Normocephalic and atraumatic without mass or lesion Palpation: Facial skeleton intact without bony stepoffs Salivary Glands: No mass or tenderness Facial Strength: Facial motility symmetric and full bilaterally ENT Pinna: External ear intact and fully developed External canal: Canal is patent with intact skin Tympanic Membrane: Clear and mobile External Nose: No scar or anatomic deformity Internal Nose: Septum intact and midline. No edema, polyp,  or rhinorrhea Lips, Teeth,  and gums: Mucosa and teeth intact and viable TMJ: No pain to palpation with full mobility Oral cavity/oropharynx: No erythema or exudate, no lesions present Larynx: b/l VF are mobile without lesions, moderate post-cricoid edema and pachydermia Neck Neck and Trachea: Midline trachea without mass or lesion Thyroid: No mass or nodularity Lymphatics: No lymphadenopathy  Procedure Preoperative diagnosis: recurrent laryngitis   Postoperative diagnosis:   Same + GERD LPR  Procedure: Flexible fiberoptic laryngoscopy  Surgeon: Elena Larry, MD  Anesthesia: Topical lidocaine  and Afrin Complications: None Condition is stable throughout exam  Indications and consent:  The patient presents to the clinic with above symptoms. Indirect laryngoscopy view was incomplete. Thus it was recommended that they undergo a flexible fiberoptic laryngoscopy. All of the risks, benefits, and potential complications were reviewed with the patient preoperatively and verbal informed consent was obtained.  Procedure: The patient was seated upright in the clinic. Topical lidocaine  and Afrin were applied to the nasal cavity. After adequate anesthesia had occurred, I then proceeded to pass the flexible telescope into the nasal cavity. The nasal cavity was patent without rhinorrhea or polyp. The nasopharynx was also patent without mass or lesion. The base of tongue was visualized and was normal. There were no signs of pooling of secretions in the piriform sinuses. The true vocal folds were mobile bilaterally. There were no signs of glottic or supraglottic mucosal lesion or mass. There was moderate interarytenoid pachydermia and post cricoid edema. The telescope was then slowly withdrawn and the patient tolerated the procedure throughout.    Assessment/Plan: Encounter Diagnoses  Name Primary?   Chronic tonsillitis Yes   Tonsillar hypertrophy    Chronic laryngitis    Chronic GERD    Post-nasal drip    Environmental  and seasonal allergies     Assessment and Plan Assessment & Plan Recurrent tonsillitis (chronic tonsillitis)  Chronic tonsillitis with frequent episodes, significant swelling, and pain requiring at least 3 courses of abx per year. Tonsillectomy indicated due to recurrence and quality of life impact. Explained procedure, postoperative pain, 4% bleeding risk, and importance of hydration. Surgery requires general anesthesia and time off work. Risks and benefits were discussed and he would like to proceed - Schedule tonsillectomy. - Emphasized the importance of maintaining hydration post-surgery.  Chronic laryngitis and dysphonia episodes Flexible scope exam without masses or lesions, but findings c/w GERD LPR. We discussed exam findings - management of GERD LPR  GERD LPR - continue Protonix  -  Reflux Gourmet after meals - diet and lifestyle changes to minimize GERD - Refer to [employer] blog for dietary and lifestyle modifications/reflux cook book    Thank you for allowing me to participate in the care of this patient. Please do not hesitate to contact me with any questions or concerns.   Elena Larry, MD Otol[facility]ology [facility] Phone: [phone] Fax: [phone]    11/01/2023, 9:24 AM

## 2024-03-21 ENCOUNTER — Encounter (HOSPITAL_BASED_OUTPATIENT_CLINIC_OR_DEPARTMENT_OTHER): Payer: Self-pay | Admitting: Pulmonary Disease

## 2024-03-21 DIAGNOSIS — R5383 Other fatigue: Secondary | ICD-10-CM

## 2024-03-21 DIAGNOSIS — R0683 Snoring: Secondary | ICD-10-CM

## 2024-05-17 ENCOUNTER — Ambulatory Visit (HOSPITAL_BASED_OUTPATIENT_CLINIC_OR_DEPARTMENT_OTHER): Admitting: Pulmonary Disease
